# Patient Record
Sex: Female | Born: 1980 | Hispanic: Yes | Marital: Single | Smoking: Never smoker
Health system: Southern US, Community
[De-identification: ages and names within clinical notes are randomized; demographics above are authoritative.]

## PROBLEM LIST (undated history)

## (undated) DIAGNOSIS — E039 Hypothyroidism, unspecified: Secondary | ICD-10-CM

## (undated) DIAGNOSIS — Z98891 History of uterine scar from previous surgery: Secondary | ICD-10-CM

## (undated) DIAGNOSIS — N63 Unspecified lump in unspecified breast: Secondary | ICD-10-CM

## (undated) HISTORY — DX: History of uterine scar from previous surgery: Z98.891

## (undated) HISTORY — DX: Unspecified lump in unspecified breast: N63.0

## (undated) HISTORY — PX: BREAST SURGERY: SHX581

---

## 2008-08-27 ENCOUNTER — Encounter: Payer: Self-pay | Admitting: Maternal & Fetal Medicine

## 2008-08-28 ENCOUNTER — Encounter: Payer: Self-pay | Admitting: Maternal & Fetal Medicine

## 2008-08-28 DIAGNOSIS — Z98891 History of uterine scar from previous surgery: Secondary | ICD-10-CM

## 2008-08-28 HISTORY — DX: History of uterine scar from previous surgery: Z98.891

## 2008-09-07 ENCOUNTER — Emergency Department: Payer: Self-pay | Admitting: Emergency Medicine

## 2009-01-11 ENCOUNTER — Inpatient Hospital Stay: Payer: Self-pay

## 2011-08-29 DIAGNOSIS — N63 Unspecified lump in unspecified breast: Secondary | ICD-10-CM

## 2011-08-29 HISTORY — PX: BREAST CYST EXCISION: SHX579

## 2011-08-29 HISTORY — DX: Unspecified lump in unspecified breast: N63.0

## 2011-08-29 HISTORY — PX: OTHER SURGICAL HISTORY: SHX169

## 2011-12-13 ENCOUNTER — Ambulatory Visit: Payer: Self-pay

## 2011-12-27 HISTORY — PX: MASS EXCISION: SHX2000

## 2012-01-04 ENCOUNTER — Ambulatory Visit: Payer: Self-pay | Admitting: General Surgery

## 2012-08-28 HISTORY — PX: BREAST CYST EXCISION: SHX579

## 2012-10-05 ENCOUNTER — Encounter: Payer: Self-pay | Admitting: *Deleted

## 2012-10-05 DIAGNOSIS — N63 Unspecified lump in unspecified breast: Secondary | ICD-10-CM | POA: Insufficient documentation

## 2012-11-27 ENCOUNTER — Ambulatory Visit (INDEPENDENT_AMBULATORY_CARE_PROVIDER_SITE_OTHER): Payer: PRIVATE HEALTH INSURANCE | Admitting: General Surgery

## 2012-11-27 ENCOUNTER — Other Ambulatory Visit: Payer: Self-pay

## 2012-11-27 ENCOUNTER — Encounter: Payer: Self-pay | Admitting: General Surgery

## 2012-11-27 VITALS — BP 112/80 | HR 62 | Resp 12 | Ht 60.0 in | Wt 167.0 lb

## 2012-11-27 DIAGNOSIS — N63 Unspecified lump in unspecified breast: Secondary | ICD-10-CM

## 2012-11-27 NOTE — Progress Notes (Signed)
Patient ID: Kathleen Downs, female   DOB: 1981-05-05, 32 y.o.   MRN: [id number]  Chief Complaint  Patient presents with  . Procedure    core biopsy right breast 3 masses    HPI Kathleen Downs is a 32 y.o. female who presents for a core biopsy of the right breast x 3 masses. Patient states she has breast pain in the area that we are to biopsy. No other complaints at this time.  HPI  Past Medical History  Diagnosis Date  . Lump or mass in breast 2013  . Previous cesarean section 2010    Past Surgical History  Procedure Laterality Date  . Right breast biopsy Right 2013  . Mass excision Right 12/2011    History reviewed. No pertinent family history.  Social History History  Substance Use Topics  . Smoking status: Never Smoker   . Smokeless tobacco: Not on file  . Alcohol Use: No    No Known Allergies  No current outpatient prescriptions on file.   No current facility-administered medications for this visit.    Review of Systems Review of Systems  Constitutional: Negative.   Respiratory: Negative.   Cardiovascular: Negative.     Blood pressure 112/80, pulse 62, resp. rate 12, height 5' (1.524 m), weight 167 lb (75.751 kg).  Physical Exam Physical Exam  Data Reviewed    Assessment    Multiple solid masses subareolar right.     Plan    Core biopsy completed today. 2 noted uiq , one located 11-12 o'cl.         Greta Doom F 11/27/2012, 10:48 AM

## 2012-11-27 NOTE — Patient Instructions (Addendum)

## 2012-11-28 LAB — PATHOLOGY

## 2012-11-29 ENCOUNTER — Telehealth: Payer: Self-pay | Admitting: *Deleted

## 2012-11-29 DIAGNOSIS — N63 Unspecified lump in unspecified breast: Secondary | ICD-10-CM

## 2012-11-29 NOTE — Telephone Encounter (Signed)
Patient is aware through interpreter, Kandis Cocking, about results. Patient has been scheduled for a right breast diagnostic mammogram at [facility] for 12-16-12 at 1:30 pm. She will follow up in the office on 12-23-12.

## 2012-11-29 NOTE — Telephone Encounter (Signed)
Message copied by Nicholes Mango on Fri Nov 29, 2012 12:25 PM ------      Message from: Kieth Brightly      Created: Fri Nov 29, 2012  7:07 AM       Pt needs excision of the right breast masses. Pleas arrange for her to get right mammogram diagnostic and appt after with interpreter. Will repeat US here at that visit ------

## 2012-12-04 ENCOUNTER — Ambulatory Visit (INDEPENDENT_AMBULATORY_CARE_PROVIDER_SITE_OTHER): Payer: PRIVATE HEALTH INSURANCE | Admitting: *Deleted

## 2012-12-04 DIAGNOSIS — N63 Unspecified lump in unspecified breast: Secondary | ICD-10-CM

## 2012-12-04 NOTE — Patient Instructions (Addendum)
Patient here today for follow up post right breast biopsy.  Steristrip in place and aware it may come off in one week.  No bruising noted.  The patient is aware that a heating pad may be used for comfort as needed.  Aware of pathology. Schedule for mammogram and excision of the area.

## 2012-12-05 ENCOUNTER — Encounter: Payer: Self-pay | Admitting: General Surgery

## 2012-12-05 ENCOUNTER — Ambulatory Visit: Payer: Self-pay

## 2012-12-23 ENCOUNTER — Ambulatory Visit: Payer: Self-pay

## 2012-12-23 ENCOUNTER — Ambulatory Visit (INDEPENDENT_AMBULATORY_CARE_PROVIDER_SITE_OTHER): Payer: PRIVATE HEALTH INSURANCE | Admitting: General Surgery

## 2012-12-23 ENCOUNTER — Encounter: Payer: Self-pay | Admitting: General Surgery

## 2012-12-23 VITALS — BP 120/80 | HR 80 | Resp 14 | Ht 60.0 in | Wt 166.0 lb

## 2012-12-23 DIAGNOSIS — D4860 Neoplasm of uncertain behavior of unspecified breast: Secondary | ICD-10-CM

## 2012-12-23 DIAGNOSIS — N63 Unspecified lump in unspecified breast: Secondary | ICD-10-CM

## 2012-12-23 DIAGNOSIS — D4861 Neoplasm of uncertain behavior of right breast: Secondary | ICD-10-CM

## 2012-12-23 DIAGNOSIS — D249 Benign neoplasm of unspecified breast: Secondary | ICD-10-CM

## 2012-12-23 DIAGNOSIS — D241 Benign neoplasm of right breast: Secondary | ICD-10-CM

## 2012-12-23 NOTE — Progress Notes (Signed)
Patient ID: Kathleen Downs, female   DOB: 12/22/1980, 32 y.o.   MRN: [id number]  Chief Complaint  Patient presents with  . Follow-up    mammogram    HPI Kathleen Downs is a 32 y.o. female who presents for follow up mammogram. The most recent mammogram was done on 12/05/12 with birad category 2. Patient had a right breast Phylloides tumor excision on 01/04/2012 and has had a core biopsy of 3 noduules in upper areolar region done on 11/11/12 had 3 different lesions 2 were fibroadenoma, third had Phylloides changes . Patient reports no new breast problems. HPI  Past Medical History  Diagnosis Date  . Lump or mass in breast 2013  . Previous cesarean section 2010    Past Surgical History  Procedure Laterality Date  . Right breast biopsy Right 2013  . Mass excision Right 12/2011  . Breast surgery      History reviewed. No pertinent family history.  Social History History  Substance Use Topics  . Smoking status: Never Smoker   . Smokeless tobacco: Never Used  . Alcohol Use: No    No Known Allergies  No current outpatient prescriptions on file.   No current facility-administered medications for this visit.    Review of Systems Review of Systems  Constitutional: Negative.   Respiratory: Negative.   Cardiovascular: Negative.     Blood pressure 120/80, pulse 80, resp. rate 14, height 5' (1.524 m), weight 166 lb (75.297 kg), last menstrual period 12/01/2012.  Physical Exam Physical Exam  Constitutional: She appears well-developed and well-nourished.  Neck: Normal range of motion. Neck supple.  Cardiovascular: Normal rate, regular rhythm and normal heart sounds.   Pulmonary/Chest: Effort normal. Right breast exhibits no inverted nipple, no mass, no nipple discharge, no skin change and no tenderness.  Abdominal: Soft. Bowel sounds are normal.  Lymphadenopathy:    She has no cervical adenopathy.    She has no axillary adenopathy.    Data Reviewed US done today  shows stable nodules. Mammogram right was normal.  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      Plan     Excision recommended. Discussed fully with pt via interpreter.     This patient is covered under the [employer] and she will need to apply for BCCCP before we can schedule surgery. Patient is aware to meet with Joellyn Quails, BCCCP Coordinator, on 01-08-13 at 8 am. Once approved, we can arrange for surgery.   SANKAR,SEEPLAPUTHUR G 12/24/2012, 6:10 AM

## 2012-12-23 NOTE — Patient Instructions (Addendum)
Patient  reccommended to have 3 lesion of the right breast removed.  This patient is covered under the [employer] and she will need to apply for BCCCP before we can schedule surgery. Patient is aware to meet with Joellyn Quails, BCCCP Coordinator, on 01-08-13 at 8 am. Once approved, we can arrange for surgery.

## 2012-12-24 ENCOUNTER — Encounter: Payer: Self-pay | Admitting: General Surgery

## 2012-12-24 DIAGNOSIS — D249 Benign neoplasm of unspecified breast: Secondary | ICD-10-CM | POA: Insufficient documentation

## 2012-12-24 DIAGNOSIS — D4860 Neoplasm of uncertain behavior of unspecified breast: Secondary | ICD-10-CM | POA: Insufficient documentation

## 2013-01-06 ENCOUNTER — Ambulatory Visit: Payer: PRIVATE HEALTH INSURANCE | Admitting: General Surgery

## 2013-01-08 ENCOUNTER — Ambulatory Visit: Payer: Self-pay

## 2013-01-14 ENCOUNTER — Encounter: Payer: Self-pay | Admitting: General Surgery

## 2013-01-14 ENCOUNTER — Other Ambulatory Visit: Payer: Self-pay

## 2013-01-14 ENCOUNTER — Ambulatory Visit: Payer: PRIVATE HEALTH INSURANCE | Admitting: General Surgery

## 2013-01-14 VITALS — BP 110/72 | HR 88 | Resp 14 | Ht 60.0 in | Wt 164.0 lb

## 2013-01-14 DIAGNOSIS — N63 Unspecified lump in unspecified breast: Secondary | ICD-10-CM

## 2013-01-14 DIAGNOSIS — D4861 Neoplasm of uncertain behavior of right breast: Secondary | ICD-10-CM

## 2013-01-14 NOTE — Patient Instructions (Addendum)
Patient to have bilateral breast excisions scheduled. Patient will follow up after surgery is done.   Patient's surgery has been scheduled for 01-24-13 at [facility].

## 2013-01-14 NOTE — Progress Notes (Signed)
Patient ID: Kathleen Downs, female   DOB: 04-30-1981, 32 y.o.   MRN: [id number]  Chief Complaint  Patient presents with  . Breast Problem    left breast 6 o'clock    HPI Kathleen Downs is a 32 y.o. female who presents for a follow up of right breast nodules x 3. She was advised to have excision of the nodules. She has applied for [facility] as advised in last visit. She has also found another nodule in the left breast in the central inner quadrant 6 o'clock position. She denies any new problems with the breasts. No new mammograms or ultrasounds were obtained regarding this breast.  HPI  Past Medical History  Diagnosis Date  . Lump or mass in breast 2013  . Previous cesarean section 2010    Past Surgical History  Procedure Laterality Date  . Right breast biopsy Right 2013  . Mass excision Right 12/2011  . Breast surgery      History reviewed. No pertinent family history.  Social History History  Substance Use Topics  . Smoking status: Never Smoker   . Smokeless tobacco: Never Used  . Alcohol Use: No    No Known Allergies  No current outpatient prescriptions on file.   No current facility-administered medications for this visit.    Review of Systems Review of Systems  Constitutional: Negative.   Respiratory: Negative.   Cardiovascular: Negative.     Blood pressure 110/72, pulse 88, resp. rate 14, height 5' (1.524 m), weight 164 lb (74.39 kg), last menstrual period 12/01/2012.  Physical Exam Physical Exam  Constitutional: She appears well-developed and well-nourished.  Neck: No mass and no thyromegaly present.  Cardiovascular: Normal rate, regular rhythm and normal heart sounds.   Pulmonary/Chest: Effort normal and breath sounds normal. Right breast exhibits no inverted nipple, no mass, no nipple discharge, no skin change and no tenderness. Left breast exhibits mass. Left breast exhibits no inverted nipple, no nipple discharge, no skin change and no  tenderness. Breasts are symmetrical.  Left breast has a firm area smooth 6 o'clock subaerolar.  Lymphadenopathy:    She has no cervical adenopathy.    She has no axillary adenopathy.  1 cm in size.  Data Reviewed Ultrasound left breast showed hypoechoic mass at site of palpable finding.   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     Plan    I feel the patient is okay for excision of right breast masses. Feel left breast mass can be removed at the same time.      Patient's surgery has been scheduled for 01-24-13 at [facility]. This patient will need a Spanish interpreter present.    SANKAR,SEEPLAPUTHUR G 01/16/2013, 7:59 AM

## 2013-01-16 ENCOUNTER — Encounter: Payer: Self-pay | Admitting: General Surgery

## 2013-01-21 ENCOUNTER — Ambulatory Visit: Payer: Self-pay | Admitting: General Surgery

## 2013-01-24 ENCOUNTER — Ambulatory Visit: Payer: Self-pay | Admitting: General Surgery

## 2013-01-24 DIAGNOSIS — D249 Benign neoplasm of unspecified breast: Secondary | ICD-10-CM

## 2013-01-24 DIAGNOSIS — D487 Neoplasm of uncertain behavior of other specified sites: Secondary | ICD-10-CM

## 2013-01-27 ENCOUNTER — Encounter: Payer: Self-pay | Admitting: General Surgery

## 2013-01-27 LAB — PATHOLOGY REPORT

## 2013-01-28 ENCOUNTER — Encounter: Payer: Self-pay | Admitting: General Surgery

## 2013-02-05 ENCOUNTER — Encounter: Payer: Self-pay | Admitting: General Surgery

## 2013-02-11 ENCOUNTER — Encounter: Payer: Self-pay | Admitting: General Surgery

## 2013-02-11 ENCOUNTER — Ambulatory Visit (INDEPENDENT_AMBULATORY_CARE_PROVIDER_SITE_OTHER): Payer: PRIVATE HEALTH INSURANCE | Admitting: General Surgery

## 2013-02-11 VITALS — BP 110/70 | HR 70 | Resp 12 | Ht 60.0 in | Wt 164.0 lb

## 2013-02-11 DIAGNOSIS — N63 Unspecified lump in unspecified breast: Secondary | ICD-10-CM

## 2013-02-11 NOTE — Patient Instructions (Addendum)
Patient to return six months.

## 2013-02-11 NOTE — Progress Notes (Signed)
Patient ID: Kathleen Downs, female   DOB: 02/04/1981, 32 y.o.   MRN: [id number]  Chief Complaint  Patient presents with  . Routine Post Op    bilateral breast biopsy    HPI Kathleen Downs is a 32 y.o. female here for post op bilateral breast biopsy done on 01/27/13. No complaints. HPI  Past Medical History  Diagnosis Date  . Lump or mass in breast 2013  . Previous cesarean section 2010    Past Surgical History  Procedure Laterality Date  . Right breast biopsy Right 2013  . Mass excision Right 12/2011  . Breast surgery      History reviewed. No pertinent family history.  Social History History  Substance Use Topics  . Smoking status: Never Smoker   . Smokeless tobacco: Never Used  . Alcohol Use: No    No Known Allergies  No current outpatient prescriptions on file.   No current facility-administered medications for this visit.    Review of Systems Review of Systems  Constitutional: Negative.   Respiratory: Negative.   Cardiovascular: Negative.     Blood pressure 110/70, pulse 70, resp. rate 12, height 5' (1.524 m), weight 164 lb (74.39 kg), last menstrual period 01/28/2013.  Physical Exam Physical Exam  Constitutional: She appears well-developed and well-nourished.  Pulmonary/Chest: Right breast exhibits no inverted nipple, no mass, no nipple discharge, no skin change and no tenderness. Left breast exhibits no inverted nipple, no mass, no nipple discharge, no skin change and no tenderness.  Neurological: She is alert.  Skin: Skin is warm and dry.  Incisions healing well no sign of infection. Data Reviewed Path was reviewed. Fibroadenomas.  Assessment Healing wounds post surgery     Plan    Patient to return in six months.        Alani Sabbagh G 02/11/2013, 7:16 PM

## 2013-08-18 ENCOUNTER — Encounter: Payer: Self-pay | Admitting: General Surgery

## 2013-08-18 ENCOUNTER — Ambulatory Visit (INDEPENDENT_AMBULATORY_CARE_PROVIDER_SITE_OTHER): Payer: PRIVATE HEALTH INSURANCE | Admitting: General Surgery

## 2013-08-18 VITALS — BP 124/72 | HR 76 | Resp 14 | Ht 60.0 in | Wt 165.0 lb

## 2013-08-18 DIAGNOSIS — Z87898 Personal history of other specified conditions: Secondary | ICD-10-CM

## 2013-08-18 DIAGNOSIS — Z86018 Personal history of other benign neoplasm: Secondary | ICD-10-CM | POA: Insufficient documentation

## 2013-08-18 NOTE — Patient Instructions (Signed)
Patient to return as needed. 

## 2013-08-18 NOTE — Progress Notes (Signed)
Patient ID: Kathleen Downs, female   DOB: 08-07-1981, 32 y.o.   MRN: [id number]  Chief Complaint  Patient presents with  . Follow-up    mammogram    HPI Kathleen Downs is a 32 y.o. female here today for her 6 month follow up excision fribroadenoma and Phylloides tumor.No complaints.   HPI  Past Medical History  Diagnosis Date  . Lump or mass in breast 2013  . Previous cesarean section 2010    Past Surgical History  Procedure Laterality Date  . Right breast biopsy Right 2013  . Mass excision Right 12/2011  . Breast surgery      History reviewed. No pertinent family history.  Social History History  Substance Use Topics  . Smoking status: Never Smoker   . Smokeless tobacco: Never Used  . Alcohol Use: No    No Known Allergies  No current outpatient prescriptions on file.   No current facility-administered medications for this visit.    Review of Systems Review of Systems  Constitutional: Negative.   Respiratory: Negative.   Cardiovascular: Negative.     Blood pressure 124/72, pulse 76, resp. rate 14, height 5' (1.524 m), weight 165 lb (74.844 kg).  Physical Exam Physical Exam  Constitutional: She is oriented to person, place, and time. She appears well-developed and well-nourished.  Pulmonary/Chest:    Lymphadenopathy:    She has no cervical adenopathy.    She has no axillary adenopathy.  Neurological: She is alert and oriented to person, place, and time.  Skin: Skin is warm.    Data Reviewed none  Assessment    No new breast masses noted. Has had excision of benign Phylloides and fibroadenoma from right breast and a fibroadenoma from left breast.     Plan    Patient to return as needed. Advised on SBE, and need for yearly MD exam. Interpreter present for interview, exam and discussion.       Kathleen Downs 08/19/2013, 5:10 AM

## 2013-08-19 ENCOUNTER — Encounter: Payer: Self-pay | Admitting: General Surgery

## 2014-06-29 ENCOUNTER — Encounter: Payer: Self-pay | Admitting: General Surgery

## 2014-11-27 ENCOUNTER — Ambulatory Visit: Payer: Self-pay

## 2014-12-18 NOTE — Op Note (Signed)
PATIENT NAME:  Kathleen Downs, Kathleen Downs MR#:  [id number] DATE OF BIRTH:  1980/11/29  DATE OF PROCEDURE:  01/24/2013  PREOPERATIVE DIAGNOSES: 1.  Phyllodes tumor right breast. 2.  Fibroadenoma, right breast. 3.  Fibroadenoma, left breast.  OPERATIONS PERFORMED: 1.  Right breast lumpectomy.  2.  Left breast mass excision with ultrasound-guided wire localization.   ANESTHESIA:  General  COMPLICATIONS: None.   ESTIMATED BLOOD LOSS:  Less than 25 mL.   DRAINS:  None.   DESCRIPTION OF PROCEDURE:  The patient was put to sleep in a supine position on the operating table. Both breasts were prepped and draped out as a sterile field. The right breast was operated on first. This patient had a phyllodes tumor removed from the upper inner quadrant several months ago. On the follow-up evaluations was noted to have 2 masses in the subareolar region and near the 12 to 2 o'clock position. One was more superficial, the other one was deep. Core biopsy revealed one of them to be a phyllodes and the other a fibroadenoma. Ultrasound was brought up to the field with a sterile cover. The right breast area was evaluated and the 2 masses superficial and deep were identified. Accordingly, a circumareolar incision was planned. 10 mL of 0.5% Marcaine was instilled and a circumareolar incision along the upper 2 quadrants was performed. The incision was then deepened through to the subcutaneous tissue. The areola skin was then freed from the underlying tissue. In so doing, the superficial mass was identified which measured about a centimeter in size. This was excised out with some normal tissue surrounding this. With ultrasound guidance and palpation, the deepened nodule was identified a little more than a centimeter in size and with careful exposure this was excised out also with a rim of tissue surrounding this. Bleeding was controlled with cautery. Both specimens were sent to pathology separately for full evaluation. The breast  tissue was then palpated from within and showed no evidence of other nodules. From an ultrasound failed to reveal any other nodules in this area. The wound was irrigated and after ensuring hemostasis, the deeper tissue was closed with layers of 2-0 Vicryl and the skin was closed with subcuticular 3-0 Monocryl and the incision subsequently covered with Dermabond. On the left breast, the patient had a recent finding of a small mass inferiorly in the subareolar region at about the 6 o'clock position. Ultrasound used to verify this and with ultrasound guidance a Bard UltraWire was placed through this area and anchored. Circumareolar incision in the inferior aspect of the left breast was performed after instillation of 10 mL of 0.5% Marcaine. The incision was deepened through and with ultrasound help and palpation, the mass in question was identified and excised out. After ensuring hemostasis, the wound was closed similar to the right side and Dermabond applied. The procedure was well tolerated. The patient subsequently was extubated and returned to the recovery room in stable condition.    ____________________________ S.Robinette Haines, MD sgs:ce D: 01/24/2013 17:25:00 ET T: 01/25/2013 10:31:48 ET JOB#: [id number]  cc: S.G. Jamal Collin, MD, <Dictator> [facility] Robinette Haines MD ELECTRONICALLY SIGNED 01/26/2013 18:48

## 2016-07-04 LAB — OB RESULTS CONSOLE VARICELLA ZOSTER ANTIBODY, IGG: Varicella: IMMUNE

## 2016-07-04 LAB — OB RESULTS CONSOLE RPR: RPR: NONREACTIVE

## 2016-07-04 LAB — OB RESULTS CONSOLE HEPATITIS B SURFACE ANTIGEN: HEP B S AG: NEGATIVE

## 2016-07-04 LAB — OB RESULTS CONSOLE RUBELLA ANTIBODY, IGM: Rubella: IMMUNE

## 2016-10-31 LAB — OB RESULTS CONSOLE HIV ANTIBODY (ROUTINE TESTING): HIV: NONREACTIVE

## 2016-11-13 ENCOUNTER — Ambulatory Visit: Admission: RE | Admit: 2016-11-13 | Payer: Self-pay | Source: Ambulatory Visit

## 2016-12-22 LAB — OB RESULTS CONSOLE GC/CHLAMYDIA
Chlamydia: NEGATIVE
Gonorrhea: NEGATIVE

## 2016-12-22 LAB — OB RESULTS CONSOLE GBS: GBS: NEGATIVE

## 2017-01-17 ENCOUNTER — Other Ambulatory Visit: Payer: Self-pay | Admitting: Obstetrics and Gynecology

## 2017-01-17 DIAGNOSIS — Z3490 Encounter for supervision of normal pregnancy, unspecified, unspecified trimester: Secondary | ICD-10-CM

## 2017-01-17 NOTE — Progress Notes (Unsigned)
Orders placed for IOL scheduled on 01/31/17

## 2017-01-22 ENCOUNTER — Inpatient Hospital Stay: Payer: Medicaid Other | Admitting: Anesthesiology

## 2017-01-22 ENCOUNTER — Encounter: Payer: Self-pay | Admitting: *Deleted

## 2017-01-22 ENCOUNTER — Encounter
Admission: EM | Disposition: A | Discharge status: Home / Self Care | Payer: Self-pay | Source: Home / Self Care | Attending: Obstetrics and Gynecology

## 2017-01-22 ENCOUNTER — Inpatient Hospital Stay
Admission: EM | Admit: 2017-01-22 | Discharge: 2017-01-24 | DRG: 766 | Disposition: A | Discharge status: Home / Self Care | Payer: Medicaid Other | Attending: Obstetrics and Gynecology | Admitting: Obstetrics and Gynecology

## 2017-01-22 DIAGNOSIS — E039 Hypothyroidism, unspecified: Secondary | ICD-10-CM | POA: Diagnosis present

## 2017-01-22 DIAGNOSIS — O9081 Anemia of the puerperium: Secondary | ICD-10-CM | POA: Diagnosis not present

## 2017-01-22 DIAGNOSIS — Z3A39 39 weeks gestation of pregnancy: Secondary | ICD-10-CM

## 2017-01-22 DIAGNOSIS — O99284 Endocrine, nutritional and metabolic diseases complicating childbirth: Secondary | ICD-10-CM | POA: Diagnosis present

## 2017-01-22 DIAGNOSIS — Z9889 Other specified postprocedural states: Secondary | ICD-10-CM

## 2017-01-22 DIAGNOSIS — O339 Maternal care for disproportion, unspecified: Secondary | ICD-10-CM | POA: Diagnosis present

## 2017-01-22 DIAGNOSIS — O34211 Maternal care for low transverse scar from previous cesarean delivery: Principal | ICD-10-CM | POA: Diagnosis present

## 2017-01-22 DIAGNOSIS — O479 False labor, unspecified: Secondary | ICD-10-CM | POA: Diagnosis present

## 2017-01-22 HISTORY — DX: Hypothyroidism, unspecified: E03.9

## 2017-01-22 LAB — CBC WITH DIFFERENTIAL/PLATELET
Basophils Absolute: 0 10*3/uL (ref 0–0.1)
Basophils Relative: 0 %
EOS ABS: 0 10*3/uL (ref 0–0.7)
Eosinophils Relative: 0 %
HCT: 35.8 % (ref 35.0–47.0)
HEMOGLOBIN: 12.1 g/dL (ref 12.0–16.0)
LYMPHS ABS: 1.3 10*3/uL (ref 1.0–3.6)
LYMPHS PCT: 9 %
MCH: 28.2 pg (ref 26.0–34.0)
MCHC: 33.9 g/dL (ref 32.0–36.0)
MCV: 83.3 fL (ref 80.0–100.0)
Monocytes Absolute: 1 10*3/uL — ABNORMAL HIGH (ref 0.2–0.9)
Monocytes Relative: 7 %
NEUTROS PCT: 84 %
Neutro Abs: 12.7 10*3/uL — ABNORMAL HIGH (ref 1.4–6.5)
Platelets: 310 10*3/uL (ref 150–440)
RBC: 4.29 MIL/uL (ref 3.80–5.20)
RDW: 16.7 % — ABNORMAL HIGH (ref 11.5–14.5)
WBC: 15.1 10*3/uL — AB (ref 3.6–11.0)

## 2017-01-22 LAB — TYPE AND SCREEN
ABO/RH(D): O POS
Antibody Screen: NEGATIVE

## 2017-01-22 SURGERY — Surgical Case
Anesthesia: Epidural | Site: Abdomen | Wound class: Clean Contaminated

## 2017-01-22 MED ORDER — EPHEDRINE SULFATE-NACL 50-0.9 MG/10ML-% IV SOSY
PREFILLED_SYRINGE | INTRAVENOUS | Status: DC | PRN
  Administered 2017-01-22: 10 mg via INTRAVENOUS

## 2017-01-22 MED ORDER — OXYCODONE HCL 5 MG PO TABS: 5.0000 mg | ORAL_TABLET | ORAL | Status: DC | PRN

## 2017-01-22 MED ORDER — LIDOCAINE-EPINEPHRINE (PF) 2 %-1:200000 IJ SOLN
INTRAMUSCULAR | Status: DC | PRN
  Administered 2017-01-22: 5 mL via INTRADERMAL
  Administered 2017-01-22 (×2): 3 mL via INTRADERMAL
  Administered 2017-01-22: 5 mL via INTRADERMAL

## 2017-01-22 MED ORDER — ZOLPIDEM TARTRATE 5 MG PO TABS: 5.0000 mg | ORAL_TABLET | Freq: Every evening | ORAL | Status: DC | PRN

## 2017-01-22 MED ORDER — ACETAMINOPHEN 325 MG PO TABS
650.0000 mg | ORAL_TABLET | ORAL | Status: DC | PRN
  Filled 2017-01-22: qty 2

## 2017-01-22 MED ORDER — LIDOCAINE-EPINEPHRINE (PF) 1.5 %-1:200000 IJ SOLN
INTRAMUSCULAR | Status: DC | PRN
  Administered 2017-01-22 (×2): 3 mL

## 2017-01-22 MED ORDER — EPHEDRINE 5 MG/ML INJ: 10.0000 mg | INTRAVENOUS | Status: DC | PRN

## 2017-01-22 MED ORDER — DIPHENHYDRAMINE HCL 50 MG/ML IJ SOLN: 12.5000 mg | INTRAMUSCULAR | Status: DC | PRN

## 2017-01-22 MED ORDER — SIMETHICONE 80 MG PO CHEW
80.0000 mg | CHEWABLE_TABLET | ORAL | Status: DC
Start: 2017-01-23 — End: 2017-01-24
  Administered 2017-01-23: 80 mg via ORAL
  Filled 2017-01-22: qty 1

## 2017-01-22 MED ORDER — SODIUM CHLORIDE FLUSH 0.9 % IV SOLN
INTRAVENOUS | Status: AC
  Filled 2017-01-22: qty 10

## 2017-01-22 MED ORDER — NALBUPHINE HCL 10 MG/ML IJ SOLN: 5.0000 mg | INTRAMUSCULAR | Status: DC | PRN

## 2017-01-22 MED ORDER — BUPIVACAINE LIPOSOME 1.3 % IJ SUSP
INTRAMUSCULAR | Status: DC | PRN
  Administered 2017-01-22: 87 mL

## 2017-01-22 MED ORDER — OXYCODONE-ACETAMINOPHEN 5-325 MG PO TABS: 1.0000 | ORAL_TABLET | ORAL | Status: DC | PRN

## 2017-01-22 MED ORDER — LACTATED RINGERS IV SOLN: 500.0000 mL | Freq: Once | INTRAVENOUS | Status: DC

## 2017-01-22 MED ORDER — ONDANSETRON HCL 4 MG/2ML IJ SOLN: 4.0000 mg | Freq: Three times a day (TID) | INTRAMUSCULAR | Status: DC | PRN

## 2017-01-22 MED ORDER — WITCH HAZEL-GLYCERIN EX PADS: 1.0000 "application " | MEDICATED_PAD | CUTANEOUS | Status: DC | PRN

## 2017-01-22 MED ORDER — LIDOCAINE HCL (PF) 1 % IJ SOLN
30.0000 mL | INTRAMUSCULAR | Status: DC | PRN
  Filled 2017-01-22: qty 30

## 2017-01-22 MED ORDER — IBUPROFEN 600 MG PO TABS
600.0000 mg | ORAL_TABLET | Freq: Four times a day (QID) | ORAL | Status: DC
Start: 2017-01-23 — End: 2017-01-23

## 2017-01-22 MED ORDER — OXYTOCIN BOLUS FROM INFUSION: 500.0000 mL | Freq: Once | INTRAVENOUS | Status: DC

## 2017-01-22 MED ORDER — MORPHINE SULFATE (PF) 0.5 MG/ML IJ SOLN
INTRAMUSCULAR | Status: AC
  Filled 2017-01-22: qty 10

## 2017-01-22 MED ORDER — OXYTOCIN 40 UNITS IN LACTATED RINGERS INFUSION - SIMPLE MED
2.5000 [IU]/h | INTRAVENOUS | Status: DC
  Filled 2017-01-22: qty 1000

## 2017-01-22 MED ORDER — LACTATED RINGERS IV SOLN: INTRAVENOUS | Status: DC

## 2017-01-22 MED ORDER — OXYCODONE HCL 5 MG PO TABS: 10.0000 mg | ORAL_TABLET | ORAL | Status: DC | PRN

## 2017-01-22 MED ORDER — COCONUT OIL OIL: 1.0000 "application " | TOPICAL_OIL | Status: DC | PRN

## 2017-01-22 MED ORDER — SENNOSIDES-DOCUSATE SODIUM 8.6-50 MG PO TABS
2.0000 | ORAL_TABLET | ORAL | Status: DC
Start: 2017-01-23 — End: 2017-01-24
  Administered 2017-01-23 (×2): 2 via ORAL
  Filled 2017-01-22 (×2): qty 2

## 2017-01-22 MED ORDER — LEVOTHYROXINE SODIUM 50 MCG PO TABS
100.0000 ug | ORAL_TABLET | Freq: Every day | ORAL | Status: DC
  Administered 2017-01-22 – 2017-01-24 (×2): 100 ug via ORAL
  Filled 2017-01-22: qty 1
  Filled 2017-01-22: qty 2

## 2017-01-22 MED ORDER — DIPHENHYDRAMINE HCL 25 MG PO CAPS: 25.0000 mg | ORAL_CAPSULE | Freq: Four times a day (QID) | ORAL | Status: DC | PRN

## 2017-01-22 MED ORDER — KETOROLAC TROMETHAMINE 30 MG/ML IJ SOLN
30.0000 mg | Freq: Four times a day (QID) | INTRAMUSCULAR | Status: AC
  Administered 2017-01-22 – 2017-01-23 (×4): 30 mg via INTRAVENOUS
  Filled 2017-01-22 (×3): qty 1

## 2017-01-22 MED ORDER — FENTANYL 2.5 MCG/ML W/ROPIVACAINE 0.2% IN NS 100 ML EPIDURAL INFUSION (ARMC-ANES)
EPIDURAL | Status: DC | PRN
  Administered 2017-01-22: 10 mL/h via EPIDURAL

## 2017-01-22 MED ORDER — LACTATED RINGERS IV SOLN
INTRAVENOUS | Status: DC
  Administered 2017-01-22: 1000 mL via INTRAVENOUS

## 2017-01-22 MED ORDER — OXYTOCIN 40 UNITS IN LACTATED RINGERS INFUSION - SIMPLE MED: 2.5000 [IU]/h | INTRAVENOUS | Status: AC

## 2017-01-22 MED ORDER — MENTHOL 3 MG MT LOZG
1.0000 | LOZENGE | OROMUCOSAL | Status: DC | PRN
  Filled 2017-01-22: qty 9

## 2017-01-22 MED ORDER — LIDOCAINE 2% (20 MG/ML) 5 ML SYRINGE: INTRAMUSCULAR | Status: DC | PRN

## 2017-01-22 MED ORDER — ONDANSETRON HCL 4 MG/2ML IJ SOLN
4.0000 mg | Freq: Four times a day (QID) | INTRAMUSCULAR | Status: DC | PRN
  Administered 2017-01-22: 4 mg via INTRAVENOUS

## 2017-01-22 MED ORDER — DEXTROSE 5 % IV SOLN
500.0000 mg | INTRAVENOUS | Status: DC
  Administered 2017-01-22: 500 mg via INTRAVENOUS
  Filled 2017-01-22: qty 500

## 2017-01-22 MED ORDER — AMMONIA AROMATIC IN INHA
RESPIRATORY_TRACT | Status: AC
  Filled 2017-01-22: qty 10

## 2017-01-22 MED ORDER — BUTORPHANOL TARTRATE 2 MG/ML IJ SOLN: 1.0000 mg | INTRAMUSCULAR | Status: DC | PRN

## 2017-01-22 MED ORDER — TETANUS-DIPHTH-ACELL PERTUSSIS 5-2.5-18.5 LF-MCG/0.5 IM SUSP: 0.5000 mL | Freq: Once | INTRAMUSCULAR | Status: DC

## 2017-01-22 MED ORDER — OXYCODONE-ACETAMINOPHEN 5-325 MG PO TABS: 2.0000 | ORAL_TABLET | ORAL | Status: DC | PRN

## 2017-01-22 MED ORDER — BUPIVACAINE HCL (PF) 0.5 % IJ SOLN
INTRAMUSCULAR | Status: AC
  Filled 2017-01-22: qty 30

## 2017-01-22 MED ORDER — SODIUM CHLORIDE 0.9 % IJ SOLN
INTRAMUSCULAR | Status: AC
  Filled 2017-01-22: qty 50

## 2017-01-22 MED ORDER — NALBUPHINE HCL 10 MG/ML IJ SOLN: 5.0000 mg | Freq: Once | INTRAMUSCULAR | Status: DC | PRN

## 2017-01-22 MED ORDER — CEFAZOLIN SODIUM-DEXTROSE 1-4 GM/50ML-% IV SOLN
INTRAVENOUS | Status: AC
  Filled 2017-01-22: qty 100

## 2017-01-22 MED ORDER — SCOPOLAMINE 1 MG/3DAYS TD PT72: 1.0000 | MEDICATED_PATCH | Freq: Once | TRANSDERMAL | Status: DC

## 2017-01-22 MED ORDER — LACTATED RINGERS IV SOLN
INTRAVENOUS | Status: DC
  Administered 2017-01-23: 11:00:00 via INTRAVENOUS

## 2017-01-22 MED ORDER — LACTATED RINGERS IV SOLN
INTRAVENOUS | Status: DC | PRN
  Administered 2017-01-22: 19:00:00 via INTRAVENOUS

## 2017-01-22 MED ORDER — SIMETHICONE 80 MG PO CHEW: 80.0000 mg | CHEWABLE_TABLET | ORAL | Status: DC | PRN

## 2017-01-22 MED ORDER — OXYTOCIN 40 UNITS IN LACTATED RINGERS INFUSION - SIMPLE MED
INTRAVENOUS | Status: DC | PRN
  Administered 2017-01-22: 300 mL via INTRAVENOUS

## 2017-01-22 MED ORDER — KETOROLAC TROMETHAMINE 30 MG/ML IJ SOLN: 30.0000 mg | Freq: Four times a day (QID) | INTRAMUSCULAR | Status: AC

## 2017-01-22 MED ORDER — SIMETHICONE 80 MG PO CHEW
80.0000 mg | CHEWABLE_TABLET | Freq: Three times a day (TID) | ORAL | Status: DC
  Administered 2017-01-23 – 2017-01-24 (×4): 80 mg via ORAL
  Filled 2017-01-22 (×4): qty 1

## 2017-01-22 MED ORDER — ACETAMINOPHEN 325 MG PO TABS
650.0000 mg | ORAL_TABLET | Freq: Four times a day (QID) | ORAL | Status: AC
  Administered 2017-01-22 – 2017-01-23 (×4): 650 mg via ORAL
  Filled 2017-01-22 (×3): qty 2

## 2017-01-22 MED ORDER — BUPIVACAINE HCL (PF) 0.25 % IJ SOLN
INTRAMUSCULAR | Status: DC | PRN
  Administered 2017-01-22 (×3): 5 mL via EPIDURAL

## 2017-01-22 MED ORDER — ACETAMINOPHEN 325 MG PO TABS
650.0000 mg | ORAL_TABLET | ORAL | Status: DC | PRN
  Administered 2017-01-23 – 2017-01-24 (×4): 650 mg via ORAL
  Filled 2017-01-22 (×4): qty 2

## 2017-01-22 MED ORDER — LACTATED RINGERS IV SOLN: 500.0000 mL | INTRAVENOUS | Status: DC | PRN

## 2017-01-22 MED ORDER — NALOXONE HCL 0.4 MG/ML IJ SOLN: 0.4000 mg | INTRAMUSCULAR | Status: DC | PRN

## 2017-01-22 MED ORDER — PHENYLEPHRINE HCL 10 MG/ML IJ SOLN
INTRAMUSCULAR | Status: DC | PRN
  Administered 2017-01-22 (×4): 100 ug via INTRAVENOUS

## 2017-01-22 MED ORDER — PRENATAL MULTIVITAMIN CH
1.0000 | ORAL_TABLET | Freq: Every day | ORAL | Status: DC
Start: 2017-01-23 — End: 2017-01-24
  Administered 2017-01-23: 1 via ORAL
  Filled 2017-01-22: qty 1

## 2017-01-22 MED ORDER — BUPIVACAINE LIPOSOME 1.3 % IJ SUSP
20.0000 mL | Freq: Once | INTRAMUSCULAR | Status: DC
  Filled 2017-01-22: qty 20

## 2017-01-22 MED ORDER — SOD CITRATE-CITRIC ACID 500-334 MG/5ML PO SOLN
30.0000 mL | ORAL | Status: DC | PRN
  Filled 2017-01-22: qty 30

## 2017-01-22 MED ORDER — PHENYLEPHRINE 40 MCG/ML (10ML) SYRINGE FOR IV PUSH (FOR BLOOD PRESSURE SUPPORT): 80.0000 ug | PREFILLED_SYRINGE | INTRAVENOUS | Status: DC | PRN

## 2017-01-22 MED ORDER — FENTANYL 2.5 MCG/ML W/ROPIVACAINE 0.2% IN NS 100 ML EPIDURAL INFUSION (ARMC-ANES)
EPIDURAL | Status: AC
  Filled 2017-01-22: qty 100

## 2017-01-22 MED ORDER — MORPHINE SULFATE-NACL 0.5-0.9 MG/ML-% IV SOSY
PREFILLED_SYRINGE | INTRAVENOUS | Status: DC | PRN
  Administered 2017-01-22: 3 mg via EPIDURAL

## 2017-01-22 MED ORDER — PROPOFOL 10 MG/ML IV BOLUS
INTRAVENOUS | Status: AC
  Filled 2017-01-22: qty 20

## 2017-01-22 MED ORDER — MEPERIDINE HCL 50 MG/ML IJ SOLN: 6.2500 mg | INTRAMUSCULAR | Status: DC | PRN

## 2017-01-22 MED ORDER — SODIUM CHLORIDE 0.9% FLUSH: 3.0000 mL | INTRAVENOUS | Status: DC | PRN

## 2017-01-22 MED ORDER — FENTANYL 2.5 MCG/ML W/ROPIVACAINE 0.2% IN NS 100 ML EPIDURAL INFUSION (ARMC-ANES): 10.0000 mL/h | EPIDURAL | Status: DC

## 2017-01-22 MED ORDER — DIPHENHYDRAMINE HCL 25 MG PO CAPS: 25.0000 mg | ORAL_CAPSULE | ORAL | Status: DC | PRN

## 2017-01-22 MED ORDER — NALOXONE HCL 2 MG/2ML IJ SOSY
1.0000 ug/kg/h | PREFILLED_SYRINGE | INTRAVENOUS | Status: DC | PRN
  Filled 2017-01-22: qty 2

## 2017-01-22 MED ORDER — OXYTOCIN 10 UNIT/ML IJ SOLN
INTRAMUSCULAR | Status: AC
  Filled 2017-01-22: qty 2

## 2017-01-22 MED ORDER — CEFAZOLIN SODIUM 1 G IJ SOLR
INTRAMUSCULAR | Status: DC | PRN
  Administered 2017-01-22: 2 g via INTRAMUSCULAR

## 2017-01-22 MED ORDER — CEFAZOLIN SODIUM-DEXTROSE 2-4 GM/100ML-% IV SOLN
2.0000 g | Freq: Once | INTRAVENOUS | Status: DC
  Filled 2017-01-22: qty 100

## 2017-01-22 MED ORDER — KETOROLAC TROMETHAMINE 30 MG/ML IJ SOLN
INTRAMUSCULAR | Status: AC
  Administered 2017-01-22: 30 mg via INTRAVENOUS
  Filled 2017-01-22: qty 1

## 2017-01-22 MED ORDER — DIBUCAINE 1 % RE OINT: 1.0000 "application " | TOPICAL_OINTMENT | RECTAL | Status: DC | PRN

## 2017-01-22 MED ORDER — MISOPROSTOL 200 MCG PO TABS
ORAL_TABLET | ORAL | Status: AC
  Filled 2017-01-22: qty 4

## 2017-01-22 SURGICAL SUPPLY — 27 items
BARRIER ADHS 3X4 INTERCEED (GAUZE/BANDAGES/DRESSINGS) ×6 IMPLANT
CANISTER SUCT 3000ML PPV (MISCELLANEOUS) ×3 IMPLANT
CATH KIT ON-Q SILVERSOAK 5IN (CATHETERS) IMPLANT
CHLORAPREP W/TINT 26ML (MISCELLANEOUS) ×6 IMPLANT
DRSG TELFA 3X8 NADH (GAUZE/BANDAGES/DRESSINGS) IMPLANT
ELECT CAUTERY BLADE 6.4 (BLADE) ×3 IMPLANT
ELECT REM PT RETURN 9FT ADLT (ELECTROSURGICAL) ×3
ELECTRODE REM PT RTRN 9FT ADLT (ELECTROSURGICAL) ×1 IMPLANT
GAUZE SPONGE 4X4 12PLY STRL (GAUZE/BANDAGES/DRESSINGS) ×3 IMPLANT
GLOVE BIO SURGEON STRL SZ8 (GLOVE) ×18 IMPLANT
GOWN STRL REUS W/ TWL LRG LVL3 (GOWN DISPOSABLE) ×2 IMPLANT
GOWN STRL REUS W/ TWL XL LVL3 (GOWN DISPOSABLE) ×1 IMPLANT
GOWN STRL REUS W/TWL LRG LVL3 (GOWN DISPOSABLE) ×4
GOWN STRL REUS W/TWL XL LVL3 (GOWN DISPOSABLE) ×2
NDL SAFETY 22GX1.5 (NEEDLE) ×3 IMPLANT
NS IRRIG 1000ML POUR BTL (IV SOLUTION) ×3 IMPLANT
PACK C SECTION AR (MISCELLANEOUS) ×3 IMPLANT
PAD OB MATERNITY 4.3X12.25 (PERSONAL CARE ITEMS) ×6 IMPLANT
PAD PREP 24X41 OB/GYN DISP (PERSONAL CARE ITEMS) ×3 IMPLANT
SPONGE LAP 18X18 5 PK (GAUZE/BANDAGES/DRESSINGS) ×3 IMPLANT
STAPLER INSORB 30 2030 C-SECTI (MISCELLANEOUS) ×3 IMPLANT
STRAP SAFETY BODY (MISCELLANEOUS) ×3 IMPLANT
SUT CHROMIC 1 CTX 36 (SUTURE) ×9 IMPLANT
SUT CHROMIC 2 0 SH (SUTURE) ×3 IMPLANT
SUT PLAIN GUT 0 (SUTURE) IMPLANT
SUT VIC AB 0 CT1 36 (SUTURE) ×6 IMPLANT
SYR 30ML LL (SYRINGE) ×6 IMPLANT

## 2017-01-22 NOTE — H&P (Signed)
  Pallie Swigert is a 36 y.o. female. She is at [redacted]w[redacted]d gestation. Patient's last menstrual period was 04/22/2016. Estimated Date of Delivery: 01/27/17  Prenatal care site: [facility]   Chief complaint:ctx and small bloody d/c . No LOF   Pt is s/p prior c/s and desires elective TOLAC Cervical change to 3 cm  AROM  + meconium S: moderate pain with ctx   Maternal Medical History:   Past Medical History:  Diagnosis Date  . Hypothyroidism   . Lump or mass in breast 2013  . Previous cesarean section 2010    Past Surgical History:  Procedure Laterality Date  . BREAST SURGERY    . CESAREAN SECTION    . MASS EXCISION Right 12/2011  . right breast biopsy Right 2013    No Known Allergies  Prior to Admission medications   Not on File     Social History: She  reports that she has never smoked. She has never used smokeless tobacco. She reports that she does not drink alcohol or use drugs.  Family History: family history is not on file.  no history of gyn cancers  Review of Systems: Review of Systems: General: No fatigue or weight loss Eyes: No vision changes Ears: No hearing difficulty Respiratory: No cough or shortness of breath Pulmonary: No asthma or shortness of breath Cardiovascular: No chest pain, palpitations, dyspnea on exertion Gastrointestinal: No abdominal bloating, chronic diarrhea, constipations, masses, pain or hematochezia Genitourinary: No hematuria, dysuria, abnormal vaginal discharge, pelvic pain, Menometrorrhagia Lymphatic: No swollen lymph nodes Musculoskeletal: No muscle weakness Neurologic: No extremity weakness, syncope, seizure disorder Psychiatric: No history of depression, delusions or suicidal/homicidal ide      O:  Temp 98.4 F (36.9 C) (Oral)   Resp 16   Ht 5' (1.524 m)   Wt 83 kg (183 lb)   LMP 04/22/2016   BMI 35.74 kg/m  No results found for this or any previous visit (from the past 48 hour(s)).   Constitutional: NAD, AAOx3  HE/ENT:  extraocular movements grossly intact, moist mucous membranes CV: RRR PULM: nl respiratory effort, CTABL     Abd: gravid, non-tender, non-distended, soft      Ext: Non-tender, Nonedmeatous   Psych: mood appropriate, speech normal Pelvic AROM 3-4 cm 90 % / 0  + meconium CST  Negative  Baseline:  140   A/P: 36 y.o. [redacted]w[redacted]d in labor . TOLAC Admit  IUPC + FSE Activate TOLAC protocol----- Gwen Her Ezra Denne  Attending Obstetrician and Gynecologist [facility], Department of [facility]

## 2017-01-22 NOTE — OB Triage Note (Signed)
Presents with complaint of contractions every 5-6 minutes. Denies leaking of fluid but states she has noted some bloody show.

## 2017-01-22 NOTE — Brief Op Note (Signed)
01/22/2017  8:47 PM  PATIENT:  Kathleen Downs  36 y.o. female  PRE-OPERATIVE DIAGNOSIS:  decelerations, previous cesarean  POST-OPERATIVE DIAGNOSIS:  decelerations, previous cesarean  PROCEDURE:  Procedure(s): CESAREAN SECTION (N/A)  SURGEON:  Surgeon(s) and Role:    * Schermerhorn, Gwen Her, MD - Primary  PHYSICIAN ASSISTANT:   ASSISTANTS: none   ANESTHESIA:   epidural  EBL:  Total I/O In: 200 [I.V.:200] Out: 900 [Urine:300; Blood:600]  BLOOD ADMINISTERED:none  DRAINS: Urinary Catheter (Foley)   LOCAL MEDICATIONS USED:  MARCAINE   , BUPIVICAINE  and Amount: 90 ml  SPECIMEN:  No Specimen  DISPOSITION OF SPECIMEN:  N/A  COUNTS:  YES  TOURNIQUET:  * No tourniquets in log *  DICTATION: .Other Dictation: Dictation Number verbal  PLAN OF CARE: Admit to inpatient   PATIENT DISPOSITION:  PACU - hemodynamically stable.   Delay start of Pharmacological VTE agent (>24hrs) due to surgical blood loss or risk of bleeding: not applicable

## 2017-01-22 NOTE — Transfer of Care (Signed)
Immediate Anesthesia Transfer of Care Note  Patient: Kathleen Downs  Procedure(s) Performed: * No procedures listed *  Patient Location: PACU  Anesthesia Type:Epidural  Level of Consciousness: awake, alert  and oriented  Airway & Oxygen Therapy: Patient Spontanous Breathing and Patient connected to face mask oxygen  Post-op Assessment: Report given to RN and Post -op Vital signs reviewed and stable  Post vital signs: Reviewed and stable  Last Vitals:  Vitals:   01/22/17 1154 01/22/17 1545  BP: 122/79 132/76  Pulse: 84 81  Resp: 16   Temp: 36.9 C     Last Pain:  Vitals:   01/22/17 1625  TempSrc:   PainSc: 9          Complications: No apparent anesthesia complications

## 2017-01-22 NOTE — Anesthesia Preprocedure Evaluation (Signed)
Anesthesia Evaluation  Patient identified by MRN, date of birth, ID band Patient awake    Reviewed: Allergy & Precautions, NPO status , Patient's Chart, lab work & pertinent test results  History of Anesthesia Complications Negative for: history of anesthetic complications  Airway Mallampati: II  TM Distance: >3 FB Neck ROM: Full    Dental no notable dental hx.    Pulmonary neg pulmonary ROS, neg sleep apnea, neg COPD,    breath sounds clear to auscultation- rhonchi (-) wheezing      Cardiovascular Exercise Tolerance: Good (-) hypertension(-) CAD and (-) Past MI  Rhythm:Regular Rate:Normal - Systolic murmurs and - Diastolic murmurs    Neuro/Psych negative neurological ROS  negative psych ROS   GI/Hepatic negative GI ROS, Neg liver ROS,   Endo/Other  neg diabetesHypothyroidism   Renal/GU negative Renal ROS     Musculoskeletal negative musculoskeletal ROS (+)   Abdominal (+) + obese, Gravid abdomen  Peds  Hematology negative hematology ROS (+)   Anesthesia Other Findings Hx of 1 prior csection in setting of PIH and fetal intolerance of labor. No HTN during this pregnancy   Reproductive/Obstetrics (+) Pregnancy                             Anesthesia Physical Anesthesia Plan  ASA: II  Anesthesia Plan: Epidural   Post-op Pain Management:    Induction:   Airway Management Planned:   Additional Equipment:   Intra-op Plan:   Post-operative Plan:   Informed Consent: I have reviewed the patients History and Physical, chart, labs and discussed the procedure including the risks, benefits and alternatives for the proposed anesthesia with the patient or authorized representative who has indicated his/her understanding and acceptance.     Plan Discussed with: Anesthesiologist  Anesthesia Plan Comments: (Plan for epidural for labor, discussed epidural vs spinal vs GA if need for  csection)        Lab Results  Component Value Date   WBC 15.1 (H) 01/22/2017   HGB 12.1 01/22/2017   HCT 35.8 01/22/2017   MCV 83.3 01/22/2017   PLT 310 01/22/2017    Anesthesia Quick Evaluation

## 2017-01-22 NOTE — Progress Notes (Signed)
Patient ID: Kathleen Downs, female   DOB: [birth year], 36 y.o.   MRN: [id number] Several late decels with ctx . O2 and position changes . If continues will call for cesarean section

## 2017-01-22 NOTE — Brief Op Note (Signed)
01/22/2017 Date of delivery  01/22/17 at [place] 8:08 PM  PATIENT:  Kathleen Downs  36 y.o. female  PRE-OPERATIVE DIAGNOSIS: non reassuring fetal monitoring , previous cesarean  POST-OPERATIVE DIAGNOSIS:  Non reassuring fetal monitoring , previous cesarean, Cephalopelvic disproportion  PROCEDURE:  Procedure(s): CESAREAN SECTION (N/A) LTCS SURGEON:  Surgeon(s) and Role:    * Ladarrious Kirksey, Gwen Her, MD - Primary  PHYSICIAN ASSISTANT: scrub tech   ASSISTANTS: none   ANESTHESIA:   epidural  EBL:  700 cc IOF 700 cc uo 300 cc BLOOD ADMINISTERED:none  DRAINS: Urinary Catheter (Foley)   LOCAL MEDICATIONS USED:  MARCAINE   , BUPIVICAINE  and Amount: 90 ml( mixed solution)  SPECIMEN:  No Specimen  DISPOSITION OF SPECIMEN:  N/A  COUNTS:  YES  TOURNIQUET:  * No tourniquets in log *  DICTATION: .Other Dictation: Dictation Number verbal  PLAN OF CARE: Admit to inpatient   PATIENT DISPOSITION:  PACU - hemodynamically stable.   Delay start of Pharmacological VTE agent (>24hrs) due to surgical blood loss or risk of bleeding: not applicable

## 2017-01-22 NOTE — Anesthesia Procedure Notes (Signed)
Epidural Patient location during procedure: OB Start time: 01/22/2017 4:35 PM End time: 01/22/2017 5:12 PM  Staffing Anesthesiologist: Emmie Niemann Performed: anesthesiologist   Preanesthetic Checklist Completed: patient identified, site marked, surgical consent, pre-op evaluation, timeout performed, IV checked, risks and benefits discussed and monitors and equipment checked  Epidural Patient position: sitting Prep: ChloraPrep Patient monitoring: heart rate, continuous pulse ox and blood pressure Approach: midline Location: L3-L4 Injection technique: LOR saline  Needle:  Needle type: Tuohy  Needle gauge: 18 G Needle length: 9 cm and 9 Needle insertion depth: 6 cm Catheter type: closed end flexible Catheter size: 20 Guage Catheter at skin depth: 10 cm Test dose: negative (0.125% bupivacaine)  Assessment Events: blood not aspirated, injection not painful, no injection resistance, negative IV test and no paresthesia  Additional Notes First 2 catheters both intravascular, both removed and replaced, 3rd attempt at new level, negative aspiration.  Patient tolerated the insertion well without complications.Reason for block:procedure for pain

## 2017-01-22 NOTE — Progress Notes (Signed)
Patient ID: Kathleen Downs, female   DOB: 1981/07/13, 36 y.o.   MRN: [id number] Non reassuring fetal monitoring . LAte appearing decels Recommend repeat LTCS , pt declines BTL .  Risks of the procedure discussed . Translator present . All questions answered .  Consent signed

## 2017-01-22 NOTE — Progress Notes (Signed)
Patient ID: Kathleen Downs, female   DOB: 08-20-1981, 36 y.o.   MRN: [id number]  Kathleen Downs is a 36 y.o. female. She is at [redacted]w[redacted]d gestation. Patient's last menstrual period was 04/22/2016. Estimated Date of Delivery: 01/27/17  Prenatal care site: [facility]   Chief complaint:ctx and small bloody d/c . No LOF   Pt is s/p prior c/s and desires elective TOLAC  S: moderate pain with ctx   Maternal Medical History:   Past Medical History:  Diagnosis Date  . Hypothyroidism   . Lump or mass in breast 2013  . Previous cesarean section 2010    Past Surgical History:  Procedure Laterality Date  . BREAST SURGERY    . CESAREAN SECTION    . MASS EXCISION Right 12/2011  . right breast biopsy Right 2013    No Known Allergies  Prior to Admission medications   Not on File     Social History: She  reports that she has never smoked. She has never used smokeless tobacco. She reports that she does not drink alcohol or use drugs.  Family History: family history is not on file.  no history of gyn cancers  Review of Systems: Review of Systems: General: No fatigue or weight loss Eyes: No vision changes Ears: No hearing difficulty Respiratory: No cough or shortness of breath Pulmonary: No asthma or shortness of breath Cardiovascular: No chest pain, palpitations, dyspnea on exertion Gastrointestinal: No abdominal bloating, chronic diarrhea, constipations, masses, pain or hematochezia Genitourinary: No hematuria, dysuria, abnormal vaginal discharge, pelvic pain, Menometrorrhagia Lymphatic: No swollen lymph nodes Musculoskeletal: No muscle weakness Neurologic: No extremity weakness, syncope, seizure disorder Psychiatric: No history of depression, delusions or suicidal/homicidal ide      O:  Temp 98.4 F (36.9 C) (Oral)   Resp 16   Ht 5' (1.524 m)   Wt 83 kg (183 lb)   LMP 04/22/2016   BMI 35.74 kg/m  No results found for this or any previous visit (from the past 48  hour(s)).   Constitutional: NAD, AAOx3  HE/ENT: extraocular movements grossly intact, moist mucous membranes CV: RRR PULM: nl respiratory effort, CTABL     Abd: gravid, non-tender, non-distended, soft      Ext: Non-tender, Nonedmeatous   Psych: mood appropriate, speech normal Pelvic 1 cm /20% / -3  CST  Negative  Baseline:  140   A/P: 36 y.o. [redacted]w[redacted]d here for antenatal surveillance for term ctx  reeval in 2 hours and if in labor admit , if not d/c home with pain meds and precautions  ----- Kathleen Downs  Attending Obstetrician and Gynecologist [facility], Department of [facility]

## 2017-01-22 NOTE — Anesthesia Post-op Follow-up Note (Cosign Needed)
Anesthesia QCDR form completed.        

## 2017-01-23 ENCOUNTER — Encounter: Payer: Self-pay | Admitting: Obstetrics and Gynecology

## 2017-01-23 LAB — CBC
HCT: 32.4 % — ABNORMAL LOW (ref 35.0–47.0)
Hemoglobin: 10.8 g/dL — ABNORMAL LOW (ref 12.0–16.0)
MCH: 28.1 pg (ref 26.0–34.0)
MCHC: 33.2 g/dL (ref 32.0–36.0)
MCV: 84.5 fL (ref 80.0–100.0)
PLATELETS: 247 10*3/uL (ref 150–440)
RBC: 3.83 MIL/uL (ref 3.80–5.20)
RDW: 16.4 % — ABNORMAL HIGH (ref 11.5–14.5)
WBC: 13.4 10*3/uL — ABNORMAL HIGH (ref 3.6–11.0)

## 2017-01-23 MED ORDER — LACTATED RINGERS IV BOLUS (SEPSIS)
500.0000 mL | Freq: Once | INTRAVENOUS | Status: AC
  Administered 2017-01-23: 500 mL via INTRAVENOUS

## 2017-01-23 MED ORDER — IBUPROFEN 600 MG PO TABS
600.0000 mg | ORAL_TABLET | Freq: Four times a day (QID) | ORAL | Status: DC
  Administered 2017-01-23 – 2017-01-24 (×3): 600 mg via ORAL
  Filled 2017-01-23 (×3): qty 1

## 2017-01-23 NOTE — Anesthesia Postprocedure Evaluation (Signed)
Anesthesia Post Note  Patient: Kathleen Downs  Procedure(s) Performed: * No procedures listed *  Patient location during evaluation: Mother Baby Anesthesia Type: Epidural Level of consciousness: awake and alert and oriented Pain management: pain level controlled Vital Signs Assessment: post-procedure vital signs reviewed and stable Respiratory status: respiratory function stable Cardiovascular status: stable Postop Assessment: no headache, no backache, epidural receding, patient able to bend at knees, no signs of nausea or vomiting and adequate PO intake Anesthetic complications: no     Last Vitals:  Vitals:   01/23/17 0147 01/23/17 0457  BP: 115/71 104/62  Pulse: 86 91  Resp: 17 17  Temp: 37.1 C 36.9 C    Last Pain:  Vitals:   01/23/17 0457  TempSrc: Oral  PainSc:                  Blima Singer

## 2017-01-23 NOTE — Discharge Summary (Signed)
Obstetric Discharge Summary   Patient ID: Patient Name: Kathleen Downs DOB: 11-Aug-1981 MRN: [id number]  Date of Admission: 01/22/2017 Date of Discharge:01/24/17 Primary OB: [facility] Gestational Age at Delivery: [redacted]w[redacted]d   Antepartum complications:previous c/s  Admitting Diagnosis: labor , desires TOLAC  Secondary Diagnoses: Patient Active Problem List   Diagnosis Date Noted  . Uterine contractions during pregnancy 01/22/2017  . Postoperative state 01/22/2017    Augmentation: None Complications: fetal intolerance to labor , + meconium  Intrapartum complications/course:  nonreassuring fetal monitoring with late decels . Underwent a repeat LTCS ( declined  BTL)  CPD requiing vaginal hand with delivery  Date of Delivery: 01/22/2017 Delivered AT:[facility]KY[facility] MD Delivery Type: repeat cesarean section, low transverse incision Anesthesia: epidural Placenta: manual Laceration:  Episiotomy: none  Newborn Data: Live born female  Birth Weight: 8 lb 3.9 oz (3740 g) APGAR: 9, 9      Postpartum Course  Patient had an uncomplicated postpartum course.  By time of discharge on POD#2, her pain was controlled on oral pain medications; she had appropriate lochia and was ambulating, voiding without difficulty and tolerating regular diet.  She was deemed stable for discharge to home.     Labs: CBC Latest Ref Rng & Units 01/23/2017 01/22/2017  WBC 3.6 - 11.0 K/uL 13.4(H) 15.1(H)  Hemoglobin 12.0 - 16.0 g/dL 10.8(L) 12.1  Hematocrit 35.0 - 47.0 % 32.4(L) 35.8  Platelets 150 - 440 K/uL 247 310   O POS  Physical exam:  BP 105/65 (BP Location: Left Arm)   Pulse 79   Temp 97.9 F (36.6 C) (Oral)   Resp 20   Ht 5' (1.524 m)   Wt 83 kg (183 lb)   LMP 04/22/2016   SpO2 98%   Breastfeeding? Unknown   BMI 35.74 kg/m  General: alert and no distress Pulm: normal respiratory effort Lochia: appropriate Abdomen: soft, NT Uterine Fundus: firm, below umbilicus Incision: c/d/i, healing well,  no significant drainage, no dehiscence, no significant erythema Extremities: No evidence of DVT seen on physical exam. No lower extremity edema.   Disposition: stable, discharge to home Baby Feeding: formula Baby Disposition: home with mom  Contraception: IUD  Prenatal Labs:     Plan:  Kathleen Downs was discharged to home in good condition. [facility]  Discharge Instructions: Per After Visit Summary. Activity: Advance as tolerated. Pelvic rest for 6 weeks.  Refer to After Visit Summary Diet: Regular Discharge Medications: Allergies as of 01/24/2017   No Known Allergies     Medication List    TAKE these medications   docusate sodium 100 MG capsule Commonly known as:  COLACE Take 1 capsule (100 mg total) by mouth daily as needed.   ibuprofen 600 MG tablet Commonly known as:  ADVIL,MOTRIN Take 1 tablet (600 mg total) by mouth every 6 (six) hours.   levothyroxine 100 MCG tablet Commonly known as:  SYNTHROID, LEVOTHROID Take 100 mcg by mouth daily before breakfast.   multivitamin-prenatal 27-0.8 MG Tabs tablet Take 1 tablet by mouth daily at 12 noon.   oxyCODONE 5 MG immediate release tablet Commonly known as:  Oxy IR/ROXICODONE Take 1 tablet (5 mg total) by mouth every 4 (four) hours as needed for moderate pain.   polyethylene glycol packet Commonly known as:  MIRALAX Take 17 g by mouth daily.      Outpatient follow up:  Follow-up Information    Kathleen Downs, Gwen Her, MD Follow up in 2 week(s).   Specialty:  Obstetrics  and Gynecology Why:  post op check  Contact information: [address] [place] [address] [phone]            Signed:  Gwen Her Kathleen Downs 01/24/17

## 2017-01-23 NOTE — Discharge Instructions (Signed)
Parto por Essie Christine, cuidados posteriores (Cesarean Delivery, Care After) Siga estas instrucciones durante las prximas semanas. Estas indicaciones le proporcionan informacin acerca de cmo deber cuidarse despus del procedimiento. El mdico tambin podr darle instrucciones ms especficas. El tratamiento ha sido planificado segn las prcticas mdicas actuales, pero en algunos casos pueden ocurrir problemas. Comunquese con el mdico si tiene algn problema o dudas despus del procedimiento. QU ESPERAR DESPUS DEL PROCEDIMIENTO Despus del procedimiento, es comn [employer] siguientes sntomas:  Una pequea cantidad de sangre o un lquido transparente que sale de la incisin.  Tiene enrojecimiento, hinchazn o dolor en la zona de la incisin.  Dolores y [employer] [place] [employer].  Hemorragia vaginal (loquios).  Calambres plvicos.  Fatiga. INSTRUCCIONES PARA EL CUIDADO EN EL HOGAR Cuidado de la incisin  Siga las indicaciones del mdico acerca del cuidado de la incisin. Haga lo siguiente:  [employer] con agua y jabn antes de Quarry manager las vendas (vendaje). Use desinfectante para manos si no dispone de [place] y [place].  Cambie el vendaje como se lo haya indicado el mdico.  No retire los puntos (suturas), las grapas cutneas, el pegamento para la piel o las tiras [place]. Es posible que estos deban quedar puestos en la piel durante 2semanas o ms tiempo. Si los bordes de las tiras adhesivas empiezan a despegarse y Therapist, sports, puede recortar los que estn sueltos. No retire las tiras [facility] por completo a menos que el mdico se lo indique.  [place] zona de la incisin para detectar signos de infeccin. Est atenta a los siguientes signos:  Aumento del enrojecimiento, la hinchazn o Conservation officer, historic buildings.  Ms lquido Delorise Shiner.  Calor.  Pus o mal olor.  [place], abrace [employer]. Esto ayuda con el dolor y Exelon Corporation posibilidad de que su incisin  se abra (dehiscencia). Haga esto hasta que cicatrice completamente. Medicamentos  [employer] Corporation medicamentos de venta libre y los recetados solamente como se lo haya indicado el mdico.  Si le recetaron un antibitico, tmelo como se lo haya indicado el mdico. No interrumpa la administracin del antibitico hasta que no lo hay terminado. Conducir  No conduzca ni opere maquinaria pesada mientras toma analgsicos recetados.  No conduzca durante 24horas si le administraron un sedante. Estilo de vida  No beba alcohol. Esto es de suma importancia si est amamantando o toma analgsicos.  No consuma productos que contengan tabaco, incluidos cigarrillos, tabaco de Higher education careers adviser o cigarrillos electrnicos. Si necesita ayuda para dejar de fumar, consulte al mdico. El tabaco puede retrasar la cicatrizacin. Comida y bebida  Beba al menos 8vasos de 8onzas (52cc) de agua todos los das a menos que el mdico le indique lo contrario. Si amamanta, quiz deba beber an ms cantidad de agua.  Ingiera alimentos ricos en [employer]. Estos alimentos pueden ayudarla a prevenir o Cytogeneticist. Los alimentos ricos en fibras incluyen, entre otros:  Panes y cereales integrales.  Arroz integral.  Coralyn Pear.  Lambert Mody y verduras frescas. Actividad  Reanude sus actividades normales como se lo haya indicado el mdico. Pregntele al mdico qu actividades son seguras para usted.  Descanse todo lo que pueda. Trate de descansar o tomar una siesta mientras el beb duerme.  No levante objetos que pesen ms que su beb o ms de 10 libras (4,5 kg), como se lo haya indicado el mdico.  Pregntele al mdico cundo puede volver a Clinical biochemist. Esto puede depender de lo siguiente:  Riesgo de sufrir  infecciones.  Velocidad de cicatrizacin.  Comodidad y [place]. El bao  No tome baos de inmersin, no nade ni use el jacuzzi hasta que el mdico lo autorice.  Pregntele al mdico si puede ducharse. Thurston Pounds solo le permitan darse baos de esponja hasta que la incisin se cure.  Mantenga el vendaje seco, como se lo haya indicado el mdico. Instrucciones generales  No use tampones ni se haga duchas vaginales hasta que el mdico la autorice.  Use lo siguiente:  Ropa cmoda y suelta.  Un sostn firme y McDougal.  Controle la sangre que elimina por la vagina para detectar cogulos. Pueden tener el aspecto de grumos de color rojo oscuro, marrn o negro.  Mantenga el perineo limpio y seco, como se lo haya indicado el mdico.  Cuando vaya al bao, siempre higiencese de adelante hacia atrs.  Si es posible, consiga que alguien la ayude a cuidar de su beb y con las tareas domsticas durante algunos das despus de recibir el alta hospitalaria  Concurra a todas las visitas de seguimiento para usted y el beb, como se lo haya indicado el mdico. Esto es importante. SOLICITE ATENCIN MDICA SI:  Tiene los siguientes sntomas:  Una secrecin vaginal con mal olor.  Dificultad para orinar.  Dolor al [place].  Aumento o disminucin repentinos de la frecuencia con que defeca.  Aumento del enrojecimiento, de la hinchazn o del dolor alrededor de la incisin.  Aumento del lquido o de la sangre que sale de la incisin.  Pus o mal olor en TEFL teacher de la incisin.  Cristy Hilts.  Una erupcin cutnea.  Poco inters o falta de inters en actividades que solan gustarle.  Dudas sobre su cuidado y el del beb.  Nuseas.  La incisin est caliente al tacto.  Sus senos se ponen rojos o duros, o Financial risk analyst.  Siente tristeza o preocupacin de forma inusual.  Vomita.  Elimina cogulos grandes por la vagina. Si elimina un cogulo de sangre, gurdelo para mostrrselo al [employer]. No elimine los cogulos sanguneos por el inodoro sin mostrrselos a su mdico.  Orina ms de lo habitual.  Se siente mareada o se desmaya.  No amamant al beb y  no tuvo su perodo menstrual en un perodo de 12 semanas posterior al parto.  Usted dej de amamantar al beb y no tuvo su perodo menstrual en un perodo de 12 semanas posterior a haber dejado de Economist. SOLICITE ATENCIN MDICA DE INMEDIATO SI:  Tiene los siguientes sntomas:  Dolor que no desaparece o no mejora con Dentist.  Dolor en el pecho.  Dificultad para respirar.  Visin borrosa o Geologist, engineering.  Pensamientos de autolesionarse o lesionar al beb.  Nuevo Media planner abdomen o en una de las piernas.  Dolor de cabeza intenso.  Se desmaya.  Tiene una hemorragia tan intensa de la vagina que empapa dos toallitas sanitarias en [place]. Esta informacin no tiene Marine scientist el consejo del mdico. Asegrese de hacerle al mdico cualquier pregunta que tenga. Document Released: 08/14/2005 Document Revised: 12/06/2015 Document Reviewed: 07/19/2015 Elsevier Interactive Patient Education  2017 [employer].

## 2017-01-23 NOTE — Op Note (Signed)
NAME:  Kathleen Downs, Kathleen Downs NO.:  [id number]  MEDICAL RECORD NO.:  [id number]  LOCATION:                                 FACILITY:  PHYSICIAN:  Laverta Baltimore, MD     DATE OF BIRTH:  DATE OF PROCEDURE:  01/22/2017 DATE OF DISCHARGE:                              OPERATIVE REPORT   PREOPERATIVE DIAGNOSIS: 1. A 39+ 2 weeks estimated gestational age. 2. Previous C-section desiring trial of labor after cesarean section. 3. Nonreassuring fetal monitoring.  POSTOPERATIVE DIAGNOSIS: 1. Nonreassuring fetal monitoring. 2. Cephalopelvic disproportion.  PROCEDURE PERFORMED:  Repeat low transverse cesarean section.  SURGEON:  Laverta Baltimore, MD  ANESTHESIA:  Surgical dosing of continuous lumbar epidural.  SURGEON:  Laverta Baltimore, MD.  FIRST ASSISTANT:  Scrub tech.  INDICATION:  A 36 year old, gravida 3, para 1, 1-0-2.  The patient is at 39+ 2 weeks estimated gestational age, admitted to Labor and delivery with active contractions and cervical change.  The patient had previously decided on a trial of labor after cesarean section.  The patient was allowed to labor and progressed to 5 cm; however, the fetus demonstrated non-reassuring fetal monitoring with intermittent recurrent late deceleration.  Thick meconium was noted on rupture of membranes.  DESCRIPTION OF PROCEDURE:  After adequate surgical dosing of continuous lumbar epidural, the patient's abdomen was prepped and draped in normal sterile fashion.  A time-out was performed.  The patient did receive 2 g of IV Ancef and 500 mg of Zithromax.  A Pfannenstiel incision was made 2 fingerbreadths above the symphysis pubis.  A sharp dissection was used to identify the fascia.  Fascia was opened in the midline and opened in a transverse fashion.  The superior aspect of the fascia was grasped with Kocher clamps and the recti muscles were dissected free.  Inferior aspect of the fascia was grasped with Kocher  clamps and the pyramidalis muscle was dissected free.  Entry into the peritoneal cavity was accomplished sharply.  The vesicular uterine peritoneal fold was identified and opened and the bladder was reflected inferiorly.  A low- transverse uterine incision was made upon entry into the amniotic cavity.  Meconium-stained fluid was noted.  Incision was extended with blunt transverse traction.  An extremely wedged fetal head was noted, which required a vaginal hand from [place].  Once the head was elevated, fetal head was then delivered followed by shoulders and body. The baby with spontaneous cry was wiped clean on the patient's abdomen while the oropharynx was suctioned with a bulb suction.  After 60 seconds, the cord was clamped, and vigorous female was passed to nursery staff who assigned Apgar scores of 7 and 9, weight 3740 g, time and date of birth Jan 22, 2016 at 19:24 p.m.  The placenta was manually delivered and the uterus was exteriorized.  The endometrial cavity was wiped clean with a laparotomy tape and the uterine incision was closed with a running 1 chromic suture.  Three additional figure-of-eight sutures were required for good hemostasis.  Fallopian tubes and ovaries appeared normal.  Posterior cul-de-sac was irrigated and suctioned.  The uterus was placed back into the abdominal cavity.  The pericolic gutters were wiped clean with laparotomy tape.  Of  note, the patient did previously decline tubal ligation at the time of surgery.  The uterine incision again appeared hemostatic.  Interceed was placed over the uterine incision in a T-shaped fashion.  The fascia was then closed with a running 0 Vicryl suture.  Good approximation of edges.  Subcutaneous tissues were irrigated and bovied for hemostasis and the skin was injected with a solution of 20 mL of 1.3% Exparel with 30 mL of 0.5% Marcaine and 50 mL of normal saline.  A 60 mL was used to inject the fascial edges.  Given the  depth of the subcutaneous tissue was approximated 3.5 cm, dead space was closed with a running 2-0 chromic suture.  The skin was reapproximated with Insorb absorbable staples. There were no complications.  ESTIMATED BLOOD LOSS:  700 mL.  INTRAOPERATIVE FLUIDS:  700 mL.  URINE OUTPUT:  300 mL.  The patient tolerated the procedure well was taken to recovery room in good condition.    ______________________________ Laverta Baltimore, MD   ______________________________ Laverta Baltimore, MD    TS/MEDQ  D:  01/22/2017  T:  01/22/2017  Job:  [id number]

## 2017-01-24 ENCOUNTER — Encounter: Payer: Self-pay | Admitting: Obstetrics and Gynecology

## 2017-01-24 LAB — RPR: RPR: NONREACTIVE

## 2017-01-24 MED ORDER — OXYCODONE HCL 5 MG PO TABS: 5.0000 mg | ORAL_TABLET | ORAL | 0 refills | Status: AC | PRN

## 2017-01-24 MED ORDER — IBUPROFEN 600 MG PO TABS: 600.0000 mg | ORAL_TABLET | Freq: Four times a day (QID) | ORAL | 0 refills | Status: AC

## 2017-01-24 MED ORDER — POLYETHYLENE GLYCOL 3350 17 G PO PACK: 17.0000 g | PACK | Freq: Every day | ORAL | 0 refills | Status: AC

## 2017-01-24 MED ORDER — DOCUSATE SODIUM 100 MG PO CAPS: 100.0000 mg | ORAL_CAPSULE | Freq: Every day | ORAL | 2 refills | Status: AC | PRN

## 2017-01-24 NOTE — Lactation Note (Signed)
This note was copied from a baby's chart. Lactation Consultation Note  Patient Name: Kathleen Downs BXUXY'[id number] Date: 01/24/2017  During [facility] rounds I spoke with Mom about her feeding plan while interpreter Leola Brazil was present. She states that she tried to breastfeed first and then she offers bottle of formula as baby "is still very hungry". She states baby latches well without any pain, but she never hears swallows and baby won't stay long. She states she "never had milk" with her other babies and that her breasts never changed in size either. She has hx of breast surgery for lump and hypothyroid. She says MD is folllowing her thyroid levels. I did inform her that low thyroid levels can lead to poor milk supply, but that taking supplements to get it in therapeutic range can help the supply with good breast care management. She declined Bf help right now as she has been giving over 30 ml of formula to baby almost every 3 hours. Volumes have already been addressed by staff. I gave her Arvin contact # and she has breastfeeding booklet in Spanish as well. She declined any other LC needs now.    Maternal Data    Feeding Feeding Type: Bottle Fed - Formula Nipple Type: Slow - flow  LATCH Score/Interventions                      Lactation Tools Discussed/Used     Consult Status      Roque Cash 01/24/2017, 11:46 AM

## 2017-01-24 NOTE — Addendum Note (Signed)
Addendum  created 01/24/17 0759 by Doreen Salvage, CRNA   Anesthesia Event edited

## 2017-01-24 NOTE — Progress Notes (Signed)
Discharge instructions reviewed with patient.  All questions answered.  Follow up appointment scheduled.  

## 2017-01-24 NOTE — Progress Notes (Signed)
  Subjective:   Doing well.  No complaints.  Has not yet voided or gotten oob.  tolerating clear PO diet, tolerating pain with PO meds. Denies: CP SOB F/C, N/V, calf pain    Objective:  Blood pressure 106/66, pulse (!) 104, temperature 98.5 F (36.9 C), temperature source Oral, resp. rate 17, height 5' (1.524 m), weight 83 kg (183 lb), last menstrual period 04/22/2016, SpO2 97 %, unknown if currently breastfeeding.  General: NAD Pulmonary: no increased work of breathing Abdomen: non-distended, non-tender, fundus firm at level of umbilicus Incision: honeycomb dressing with blood at apices. Marked from previous and no bleeding beyond what had been marked. Extremities: no edema, no erythema, no tenderness, SCDs on and active Foley in place  Results for orders placed or performed during the hospital encounter of 01/22/17 (from the past 24 hour(s))  CBC     Status: Abnormal   Collection Time: 01/23/17  5:20 AM  Result Value Ref Range   WBC 13.4 (H) 3.6 - 11.0 K/uL   RBC 3.83 3.80 - 5.20 MIL/uL   Hemoglobin 10.8 (L) 12.0 - 16.0 g/dL   HCT 32.4 (L) 35.0 - 47.0 %   MCV 84.5 80.0 - 100.0 fL   MCH 28.1 26.0 - 34.0 pg   MCHC 33.2 32.0 - 36.0 g/dL   RDW 16.4 (H) 11.5 - 14.5 %   Platelets 247 150 - 440 K/uL    Intake/Output Summary (Last 24 hours) at 01/24/17 0018 Last data filed at 01/23/17 2240  Gross per 24 hour  Intake           2899.5 ml  Output             2800 ml  Net             99.5 ml      Assessment:   36 y.o. G3P1003 postoperativeday # 1 from repeat LTCS   Plan:  1) Acute blood loss anemia - hemodynamically stable and asymptomatic - po ferrous sulfate  2) monitor bleeding from incision - if need will place pressure dressing atop this bandage.  3) d/c foley and get OOB later today  4) routine post op care  5) dispo: remain inpatient during immediate recovery  ----- Larey Days, MD Attending Obstetrician and Gynecologist [facility], Department of  [facility]

## 2018-12-25 ENCOUNTER — Other Ambulatory Visit: Payer: Self-pay | Admitting: Family Medicine

## 2018-12-25 DIAGNOSIS — D4860 Neoplasm of uncertain behavior of unspecified breast: Secondary | ICD-10-CM

## 2018-12-25 DIAGNOSIS — N6313 Unspecified lump in the right breast, lower outer quadrant: Secondary | ICD-10-CM

## 2018-12-25 DIAGNOSIS — N6320 Unspecified lump in the left breast, unspecified quadrant: Secondary | ICD-10-CM

## 2019-01-01 ENCOUNTER — Other Ambulatory Visit: Payer: Self-pay

## 2019-01-01 ENCOUNTER — Ambulatory Visit
Admission: RE | Admit: 2019-01-01 | Discharge: 2019-01-01 | Disposition: A | Discharge status: Home / Self Care | Payer: Self-pay | Source: Ambulatory Visit | Attending: Family Medicine | Admitting: Family Medicine

## 2019-01-01 DIAGNOSIS — D4860 Neoplasm of uncertain behavior of unspecified breast: Secondary | ICD-10-CM

## 2019-01-01 DIAGNOSIS — N6313 Unspecified lump in the right breast, lower outer quadrant: Secondary | ICD-10-CM

## 2019-01-01 DIAGNOSIS — N6320 Unspecified lump in the left breast, unspecified quadrant: Secondary | ICD-10-CM

## 2019-06-29 IMAGING — MG DIGITAL DIAGNOSTIC BILATERAL MAMMOGRAM WITH TOMO AND CAD
8 of 14 series · 8 of 40 positions shown · non-contrast
Comparison: Previous exam(s).

CLINICAL DATA: 38-year-old female with mild thickening in the INNER
periareolar LEFT breast, which began is erythema and now is
discolored. Also LOWER LEFT periareolar breast thickening identified
on self-examination for several years. Patient has no pain or fever.
History of remote bilateral phyllodes tumor and breast excisions.

EXAM:
DIGITAL DIAGNOSTIC BILATERAL MAMMOGRAM WITH CAD AND TOMO
ULTRASOUND LEFT BREAST

[L CC synth-2D (1 of 2)]
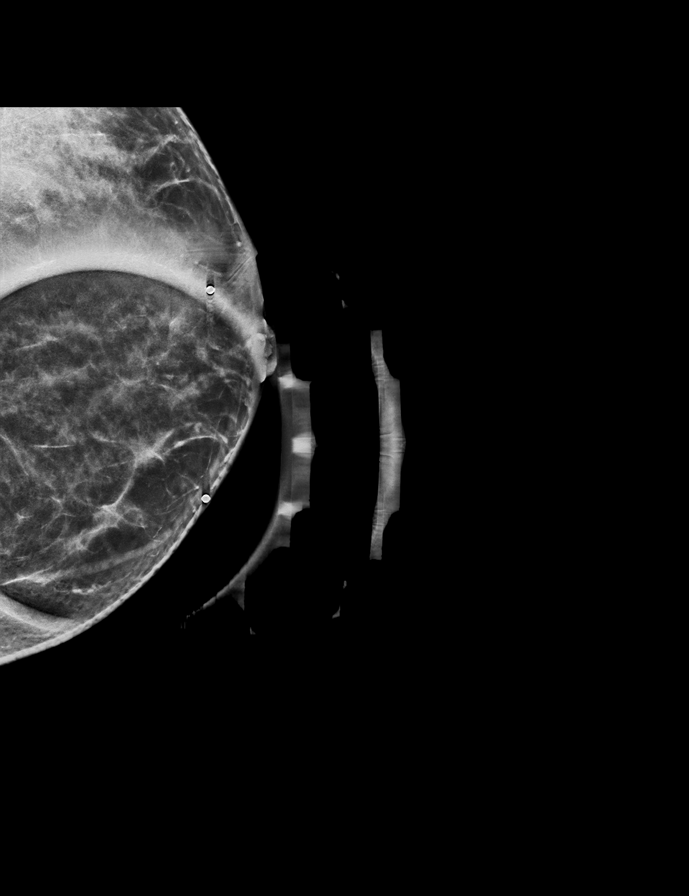

[L MLO synth-2D (1 of 3)]
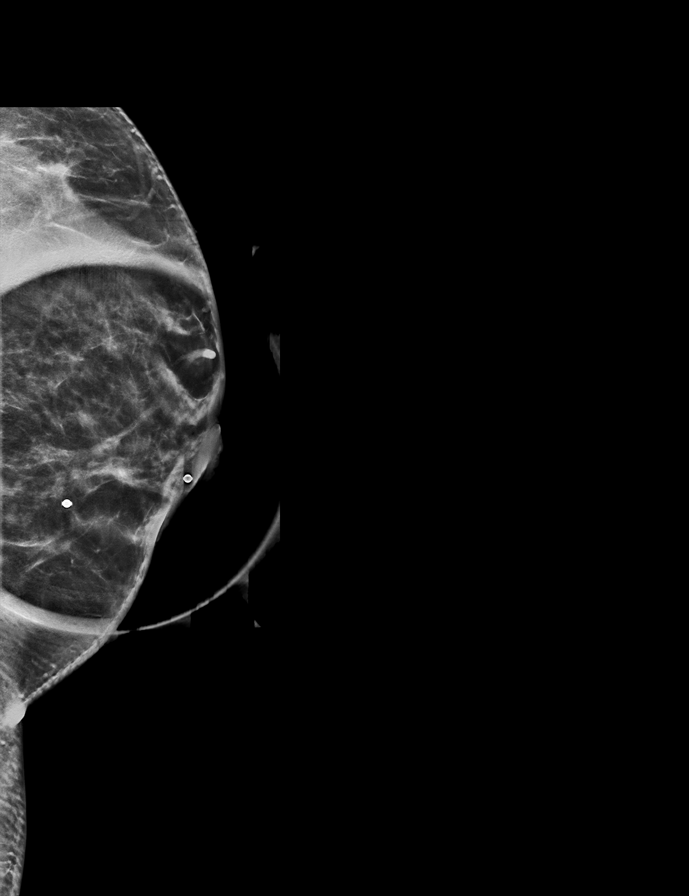

[R MLO synth-2D]
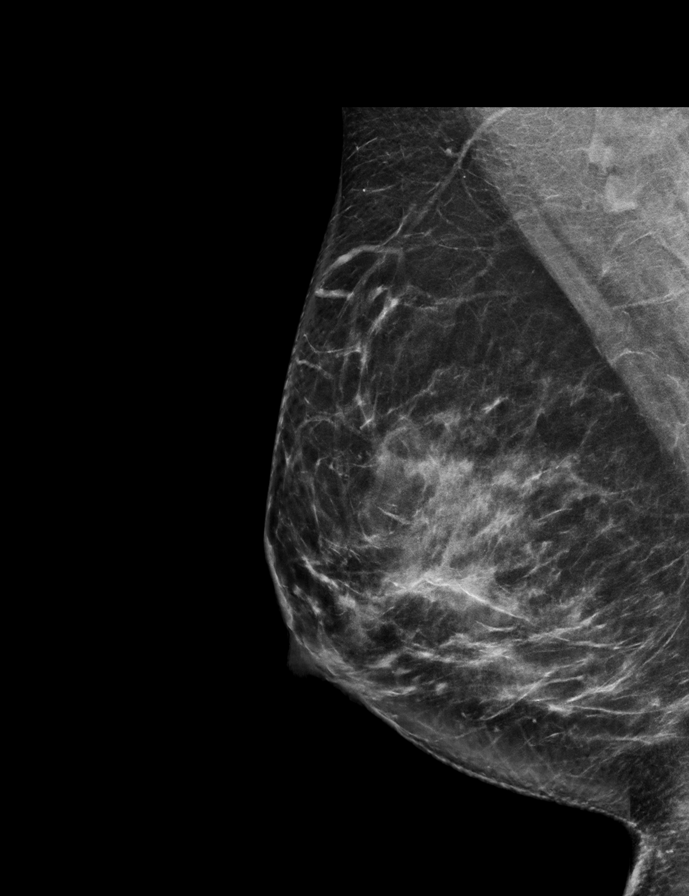

[L CC synth-2D (2 of 2)]
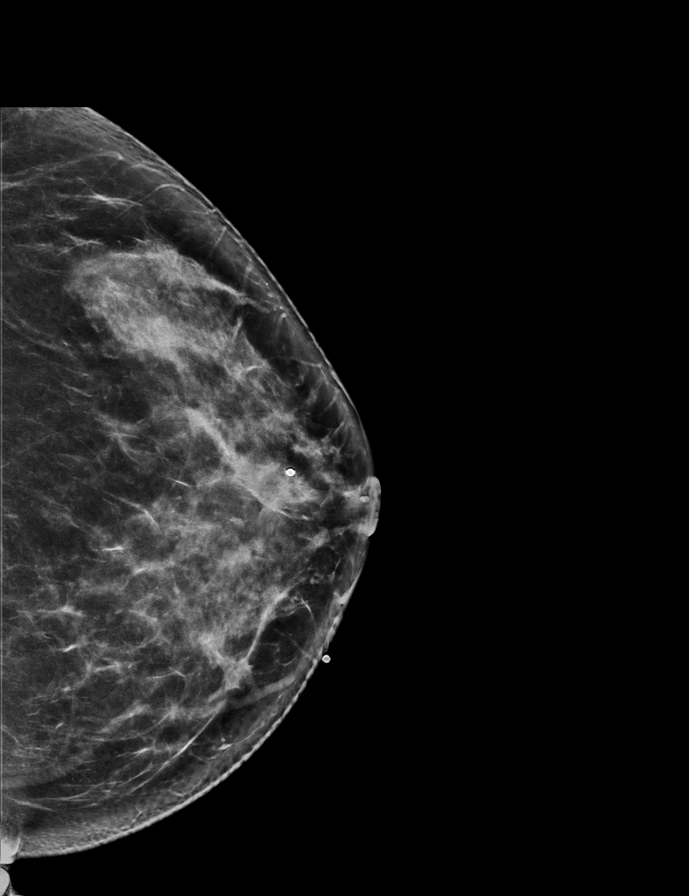

[R CC synth-2D]
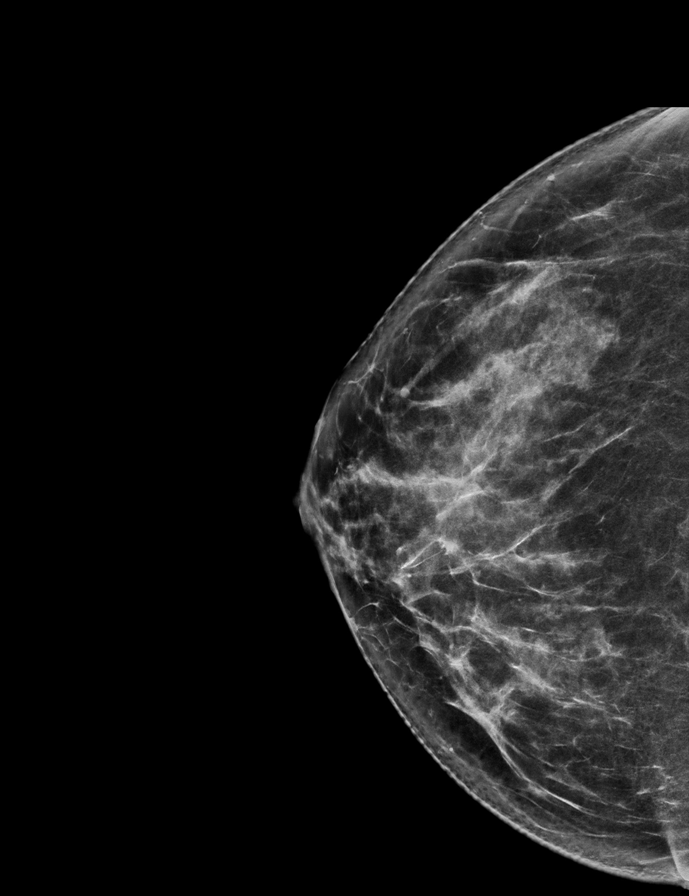

[L MLO synth-2D (2 of 3)]
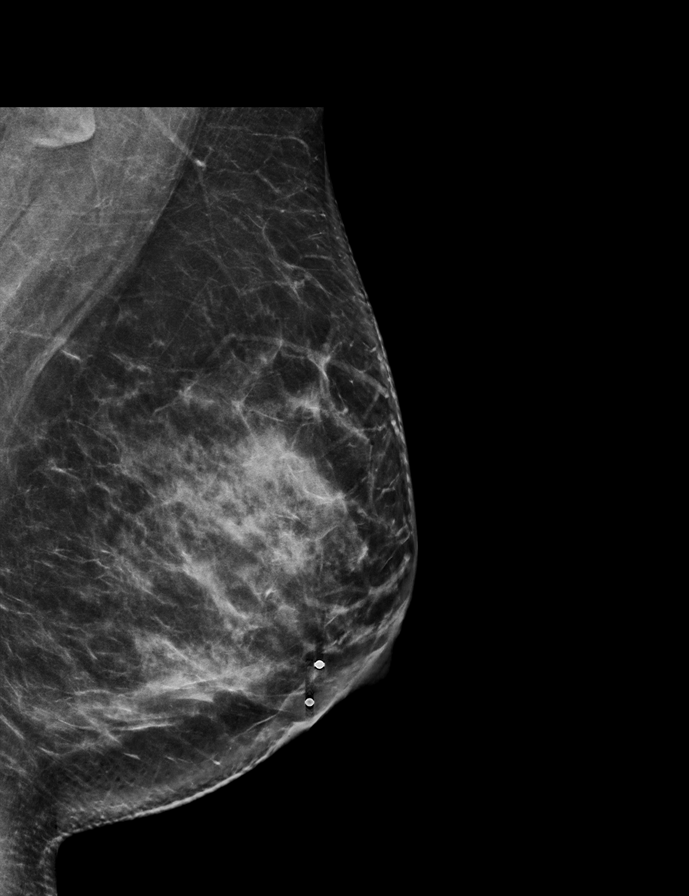

[L MLO synth-2D (3 of 3)]
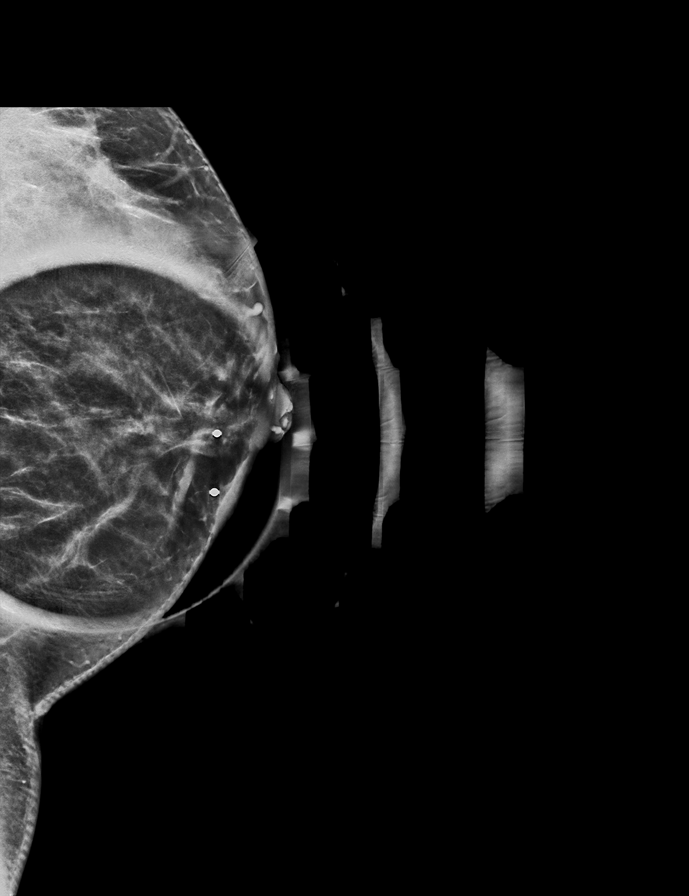

[L CC tomo · tomo slice 37/73.0]
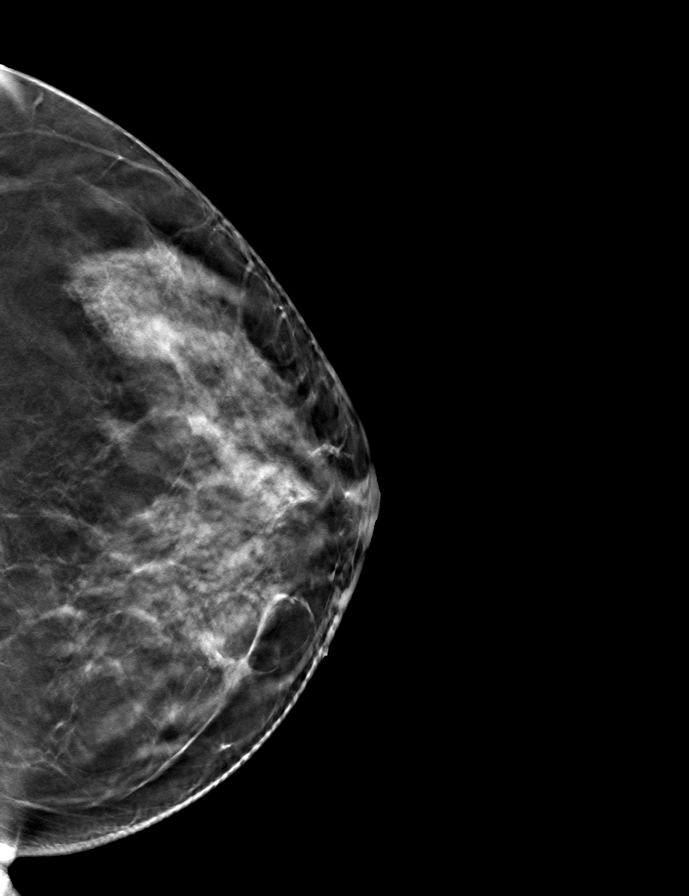

[8 of 40 positions shown; findings below may reference images not displayed]

ACR Breast Density Category c: The breast tissue is heterogeneously
dense, which may obscure small masses.
FINDINGS: 2D/3D full field views of both breasts and spot compression views of
the LEFT breast demonstrate no suspicious mass, nonsurgical
distortion or worrisome calcifications.

Surgical changes within both breasts identified.

Mammographic images were processed with CAD.

On physical exam, a small area of light brown discoloration of the
INNER periareolar skin noted. No suspicious palpable abnormalities
noted.

Targeted ultrasound is performed, showing a 2.4 x 0.2 x 1.4 cm
sliver of subdermal fluid at the 9 o'clock position of the LEFT
breast 3 cm from the nipple underlying the skin discoloration,
likely post infectious or inflammatory fluid.

No solid or cystic mass, distortion or suspicious shadowing
identified within the LOWER periareolar LEFT breast in the other
area of patient concern.
IMPRESSION: 1. Benign sliver of subdermal fluid within the INNER periareolar
LEFT breast, likely post infectious or inflammatory fluid. As the
patient has no overlying erythema, pain or signs of infection, no
further imaging follow-up of this area recommended. Consider
clinical follow-up for skin discoloration.
2. No suspicious mammographic or sonographic abnormalities within
the LOWER LEFT periareolar region, in the other area of patient
concern.
3. No mammographic evidence of breast malignancy.

RECOMMENDATION:
Bilateral screening mammogram at age 41.

Clinical follow-up recommended for the area of concern in the LEFT
breast. Any further workup should be based on clinical grounds.

I have discussed the findings and recommendations with the patient.
Results were also provided in writing at the conclusion of the
visit. If applicable, a reminder letter will be sent to the patient
regarding the next appointment.

BI-RADS CATEGORY  2: Benign.

## 2019-11-15 ENCOUNTER — Ambulatory Visit: Payer: Self-pay | Attending: Internal Medicine

## 2019-11-15 DIAGNOSIS — Z23 Encounter for immunization: Secondary | ICD-10-CM

## 2019-11-15 NOTE — Progress Notes (Signed)
   Covid-19 Vaccination Clinic  Name:  Kathleen Downs    MRN: [id number] DOB: 07-26-1981  11/15/2019  Ms. Kiri Molinaro was observed post Covid-19 immunization for 15 minutes without incident. She was provided with Vaccine Information Sheet and instruction to access the V-Safe system.   Ms. Aashika Littlejohn was instructed to call 911 with any severe reactions post vaccine: Marland Kitchen Difficulty breathing  . Swelling of face and throat  . A fast heartbeat  . A bad rash all over body  . Dizziness and weakness   Immunizations Administered    Name Date Dose VIS Date Route   Pfizer COVID-19 Vaccine 11/15/2019  2:50 PM 0.3 mL 08/08/2019 Intramuscular   Manufacturer: [place]   Lot: [id number]   [place]: [id number]

## 2019-12-06 ENCOUNTER — Ambulatory Visit: Payer: Self-pay | Attending: Internal Medicine

## 2019-12-06 DIAGNOSIS — Z23 Encounter for immunization: Secondary | ICD-10-CM

## 2019-12-06 NOTE — Progress Notes (Signed)
   Covid-19 Vaccination Clinic  Name:  Kathleen Downs    MRN: [id number] DOB: 14-Sep-1980  12/06/2019  Ms. Kathleen Downs was observed post Covid-19 immunization for 15 minutes without incident. She was provided with Vaccine Information Sheet and instruction to access the V-Safe system.   Ms. Kathleen Downs was instructed to call 911 with any severe reactions post vaccine: Marland Kitchen Difficulty breathing  . Swelling of face and throat  . A fast heartbeat  . A bad rash all over body  . Dizziness and weakness   Immunizations Administered    Name Date Dose VIS Date Route   Pfizer COVID-19 Vaccine 12/06/2019  2:35 PM 0.3 mL 08/08/2019 Intramuscular   Manufacturer: [place]   Lot: [id number]   [place]: [id number]

## 2020-08-01 ENCOUNTER — Emergency Department
Admission: EM | Admit: 2020-08-01 | Discharge: 2020-08-01 | Disposition: A | Discharge status: Home / Self Care | Payer: Self-pay | Attending: Emergency Medicine | Admitting: Emergency Medicine

## 2020-08-01 ENCOUNTER — Encounter: Payer: Self-pay | Admitting: Emergency Medicine

## 2020-08-01 ENCOUNTER — Other Ambulatory Visit: Payer: Self-pay

## 2020-08-01 DIAGNOSIS — R1013 Epigastric pain: Secondary | ICD-10-CM | POA: Insufficient documentation

## 2020-08-01 DIAGNOSIS — Z5321 Procedure and treatment not carried out due to patient leaving prior to being seen by health care provider: Secondary | ICD-10-CM | POA: Insufficient documentation

## 2020-08-01 LAB — CBC
HCT: 34.1 % — ABNORMAL LOW (ref 36.0–46.0)
Hemoglobin: 12.1 g/dL (ref 12.0–15.0)
MCH: 30.1 pg (ref 26.0–34.0)
MCHC: 35.5 g/dL (ref 30.0–36.0)
MCV: 84.8 fL (ref 80.0–100.0)
Platelets: 293 10*3/uL (ref 150–400)
RBC: 4.02 MIL/uL (ref 3.87–5.11)
RDW: 13.6 % (ref 11.5–15.5)
WBC: 14.3 10*3/uL — ABNORMAL HIGH (ref 4.0–10.5)
nRBC: 0 % (ref 0.0–0.2)

## 2020-08-01 LAB — URINALYSIS, COMPLETE (UACMP) WITH MICROSCOPIC
Bilirubin Urine: NEGATIVE
Glucose, UA: NEGATIVE mg/dL
Ketones, ur: NEGATIVE mg/dL
Leukocytes,Ua: NEGATIVE
Nitrite: NEGATIVE
Protein, ur: NEGATIVE mg/dL
Specific Gravity, Urine: 1.016 (ref 1.005–1.030)
pH: 7 (ref 5.0–8.0)

## 2020-08-01 LAB — COMPREHENSIVE METABOLIC PANEL
ALT: 63 U/L — ABNORMAL HIGH (ref 0–44)
AST: 103 U/L — ABNORMAL HIGH (ref 15–41)
Albumin: 3.9 g/dL (ref 3.5–5.0)
Alkaline Phosphatase: 74 U/L (ref 38–126)
Anion gap: 7 (ref 5–15)
BUN: 17 mg/dL (ref 6–20)
CO2: 24 mmol/L (ref 22–32)
Calcium: 8.6 mg/dL — ABNORMAL LOW (ref 8.9–10.3)
Chloride: 108 mmol/L (ref 98–111)
Creatinine, Ser: 0.48 mg/dL (ref 0.44–1.00)
GFR, Estimated: >60 mL/min (ref >60)
Glucose, Bld: 129 mg/dL — ABNORMAL HIGH (ref 70–99)
Potassium: 2.4 mmol/L — CL (ref 3.5–5.1)
Sodium: 139 mmol/L (ref 135–145)
Total Bilirubin: 0.8 mg/dL (ref 0.3–1.2)
Total Protein: 7.1 g/dL (ref 6.5–8.1)

## 2020-08-01 LAB — LIPASE, BLOOD: Lipase: 35 U/L (ref 11–51)

## 2020-08-01 NOTE — ED Notes (Signed)
Pt called for treatment room. Pt not in lobby or outside

## 2020-08-01 NOTE — ED Triage Notes (Signed)
Pt to ED iva POV c/o epigastric abdominal pain that started this morning when she woke up. Pt states that the pain is getting some better now. Pt denies any other symptoms. Pt is in NAD  Pt triaged with [facility].

## 2020-08-01 NOTE — ED Notes (Signed)
Pt called for treatment room. Pt not seen in lobby or outside

## 2020-08-01 NOTE — ED Notes (Signed)
RN informed by pt advocate that pt left

## 2020-08-03 ENCOUNTER — Telehealth: Payer: Self-pay | Admitting: Emergency Medicine

## 2020-08-03 NOTE — Telephone Encounter (Signed)
Called patient due to left emergency department before provider exam to inquire about condition and follow up plans. [place] interpreter  left message telling her I was calling to check onher and left my number.

## 2022-02-16 ENCOUNTER — Ambulatory Visit: Payer: Self-pay

## 2022-02-22 ENCOUNTER — Ambulatory Visit (LOCAL_COMMUNITY_HEALTH_CENTER): Payer: Self-pay

## 2022-02-22 DIAGNOSIS — Z111 Encounter for screening for respiratory tuberculosis: Secondary | ICD-10-CM

## 2022-02-22 DIAGNOSIS — Z719 Counseling, unspecified: Secondary | ICD-10-CM

## 2022-02-22 DIAGNOSIS — Z23 Encounter for immunization: Secondary | ICD-10-CM

## 2022-02-22 NOTE — Progress Notes (Signed)
Pt is here for immunizations required for Immigration, and a record of TB screening 20 years ago. Pt is requesting varicella, mmr and hep b vaccines. Pt reported that she had chicken pox when she was a child, offered varicella titer which she declined, pt prefers to receive vaccine instead. Administered 3 vaccines and tolerated well. TB screening was completed today per pt request. Gave copies of [facility], and record of TB screening. M.Jala Dundon, LPN.

## 2024-07-04 ENCOUNTER — Encounter: Payer: Self-pay | Admitting: Nurse Practitioner

## 2024-07-04 ENCOUNTER — Telehealth: Payer: Self-pay

## 2024-07-28 ENCOUNTER — Other Ambulatory Visit: Payer: Self-pay | Admitting: Nurse Practitioner

## 2024-07-28 DIAGNOSIS — Z1231 Encounter for screening mammogram for malignant neoplasm of breast: Secondary | ICD-10-CM

## 2024-09-04 ENCOUNTER — Ambulatory Visit
Admission: RE | Admit: 2024-09-04 | Discharge: 2024-09-04 | Disposition: A | Discharge status: Home / Self Care | Payer: Self-pay | Source: Ambulatory Visit | Attending: Nurse Practitioner | Admitting: Nurse Practitioner

## 2024-09-04 DIAGNOSIS — Z1231 Encounter for screening mammogram for malignant neoplasm of breast: Secondary | ICD-10-CM | POA: Insufficient documentation
